# Patient Record
Sex: Male | Born: 1940 | Race: White | Hispanic: No | Smoking: Never smoker
Health system: Southern US, Community
[De-identification: ages and names within clinical notes are randomized; demographics above are authoritative.]

## PROBLEM LIST (undated history)

## (undated) DIAGNOSIS — F0390 Unspecified dementia without behavioral disturbance: Secondary | ICD-10-CM

## (undated) DIAGNOSIS — E039 Hypothyroidism, unspecified: Secondary | ICD-10-CM

---

## 2016-04-28 ENCOUNTER — Emergency Department (HOSPITAL_COMMUNITY): Payer: Self-pay

## 2016-04-28 ENCOUNTER — Emergency Department (HOSPITAL_COMMUNITY)
Admission: EM | Admit: 2016-04-28 | Discharge: 2016-04-28 | Disposition: A | Discharge status: Home / Self Care | Payer: Medicare Other | Attending: Emergency Medicine | Admitting: Emergency Medicine

## 2016-04-28 ENCOUNTER — Emergency Department (HOSPITAL_COMMUNITY)
Admission: EM | Admit: 2016-04-28 | Discharge: 2016-04-28 | Discharge status: Absent without leave | Payer: Self-pay | Source: Home / Self Care | Attending: Emergency Medicine | Admitting: Emergency Medicine

## 2016-04-28 ENCOUNTER — Emergency Department (HOSPITAL_COMMUNITY): Payer: Medicare Other

## 2016-04-28 ENCOUNTER — Ambulatory Visit (HOSPITAL_COMMUNITY)
Admission: RE | Admit: 2016-04-28 | Discharge: 2016-04-28 | Disposition: A | Discharge status: Home / Self Care | Payer: Medicare Other | Source: Ambulatory Visit | Attending: Emergency Medicine | Admitting: Emergency Medicine

## 2016-04-28 ENCOUNTER — Encounter (HOSPITAL_COMMUNITY): Payer: Self-pay | Admitting: Emergency Medicine

## 2016-04-28 DIAGNOSIS — Y999 Unspecified external cause status: Secondary | ICD-10-CM | POA: Diagnosis not present

## 2016-04-28 DIAGNOSIS — F0390 Unspecified dementia without behavioral disturbance: Secondary | ICD-10-CM | POA: Insufficient documentation

## 2016-04-28 DIAGNOSIS — Y92129 Unspecified place in nursing home as the place of occurrence of the external cause: Secondary | ICD-10-CM | POA: Diagnosis not present

## 2016-04-28 DIAGNOSIS — Y939 Activity, unspecified: Secondary | ICD-10-CM | POA: Diagnosis not present

## 2016-04-28 DIAGNOSIS — Z7982 Long term (current) use of aspirin: Secondary | ICD-10-CM | POA: Insufficient documentation

## 2016-04-28 DIAGNOSIS — Z79899 Other long term (current) drug therapy: Secondary | ICD-10-CM | POA: Diagnosis not present

## 2016-04-28 DIAGNOSIS — W19XXXA Unspecified fall, initial encounter: Secondary | ICD-10-CM

## 2016-04-28 NOTE — ED Provider Notes (Signed)
Patient denies pain anywhere. He is alert. HEENT exam normocephalic atraumatic neck nontender supple. Chest nontender abdomen and pelvis nontender back without point tenderness or flank tenderness. Left upper extremity with linear abrasion approximately 3 cm dorsal forearm no deformity or swelling or tenderness. All other extremities or contusion abrasion or tenderness neurovascularly intact   Doug SouSam Jourdan Durbin, MD 04/28/16 [phone]

## 2016-04-28 NOTE — ED Notes (Signed)
Male visitor came to nurse's desk stating that she was wife of pt's HPOA and would like information on what is going on. Informed her since I don't have a copy of HPOA I would have to get patient's permission before I can giver her any information.  Patient states that he knows male and male visitors and it was ok verbally for me to give them information.

## 2016-04-28 NOTE — ED Notes (Signed)
Previous information charted in error on wrong patient arrived by mistake.

## 2016-04-28 NOTE — ED Provider Notes (Signed)
[facility]-EMERGENCY DEPT Provider Note   CSN: [id number] Arrival date & time: 04/28/16  1546     History   Chief Complaint Chief Complaint  Patient presents with  . Fall    HPI Charles Conner is a 75 y.o. male.  HPI Patient presents to the emergency department Following an unwitnessed fall at the nursing home.  The patient cannot tell me what occurred.  The patient has a history of dementia.  The nursing facility stated they found him lying on the floor next to his bed.  They found no apparent injuries, but called EMS to transport for evaluation in the emergency department History reviewed. No pertinent past medical history.  There are no active problems to display for this patient.   History reviewed. No pertinent surgical history.     Home Medications    Prior to Admission medications   Medication Sig Start Date End Date Taking? Authorizing Provider  amLODipine (NORVASC) 5 MG tablet Take 5 mg by mouth daily.   Yes Historical Provider, MD  aspirin EC 81 MG tablet Take 81 mg by mouth daily.   Yes Historical Provider, MD  calcium-vitamin D (OSCAL WITH D) 500-200 MG-UNIT tablet Take 1 tablet by mouth 2 (two) times daily.   Yes Historical Provider, MD  cholecalciferol (VITAMIN D) 1000 units tablet Take 1,000 Units by mouth daily.   Yes Historical Provider, MD  divalproex (DEPAKOTE) 250 MG DR tablet Take 250 mg by mouth 2 (two) times daily.   Yes Historical Provider, MD  donepezil (ARICEPT) 10 MG tablet Take 10 mg by mouth at bedtime.   Yes Historical Provider, MD  folic acid (FOLVITE) 1 MG tablet Take 1 mg by mouth daily.   Yes Historical Provider, MD  ibuprofen (ADVIL,MOTRIN) 800 MG tablet Take 800 mg by mouth every 6 (six) hours as needed for moderate pain.   Yes Historical Provider, MD  iron polysaccharides (POLY-IRON 150) 150 MG capsule Take 300 mg by mouth daily.   Yes Historical Provider, MD  LORazepam (ATIVAN) 1 MG tablet Take 1 mg by mouth every 6 (six) hours as needed for  anxiety.   Yes Historical Provider, MD  mirtazapine (REMERON) 30 MG tablet Take 30 mg by mouth at bedtime.   Yes Historical Provider, MD  Multiple Vitamins-Minerals (MULTIVITAMIN ADULT) TABS Take 1 tablet by mouth daily.   Yes Historical Provider, MD  pantoprazole (PROTONIX) 40 MG tablet Take 40 mg by mouth 2 (two) times daily.   Yes Historical Provider, MD  promethazine (PHENERGAN) 12.5 MG tablet Take 12.5 mg by mouth every 6 (six) hours as needed for nausea or vomiting.   Yes Historical Provider, MD    Family History No family history on file.  Social History Social History  Substance Use Topics  . Smoking status: Not on file  . Smokeless tobacco: Not on file  . Alcohol use Not on file     Allergies   Patient has no known allergies.   Review of Systems Review of Systems Level V caveat applies due to dementia  Physical Exam Updated Vital Signs BP 145/87   Pulse 88   Temp 97.7 F (36.5 C) (Oral)   Resp 16   SpO2 98%   Physical Exam  Constitutional: He is oriented to person, place, and time. He appears well-developed and well-nourished. No distress.  HENT:  Head: Normocephalic and atraumatic.  Mouth/Throat: Oropharynx is clear and moist.  Eyes: Pupils are equal, round, and reactive to light.  Neck: Normal range of  motion. Neck supple.  Cardiovascular: Normal rate, regular rhythm and normal heart sounds.  Exam reveals no gallop and no friction rub.   No murmur heard. Pulmonary/Chest: Effort normal and breath sounds normal. No respiratory distress. He has no wheezes.  Musculoskeletal: Normal range of motion.       Right hip: Normal.       Left hip: Normal.       Arms: Neurological: He is alert and oriented to person, place, and time. He exhibits normal muscle tone. Coordination normal.  Skin: Skin is warm and dry. No rash noted. No erythema.  Psychiatric: He has a normal mood and affect. His behavior is normal.  Nursing note and vitals reviewed.    ED Treatments  / Results  Labs (all labs ordered are listed, but only abnormal results are displayed) Labs Reviewed - No data to display  EKG  EKG Interpretation None       Radiology Ct Head Wo Contrast  Result Date: 04/28/2016 CLINICAL DATA:  Unwitnessed fall.  Patient was found down. EXAM: CT HEAD WITHOUT CONTRAST CT CERVICAL SPINE WITHOUT CONTRAST TECHNIQUE: Multidetector CT imaging of the head and cervical spine was performed following the standard protocol without intravenous contrast. Multiplanar CT image reconstructions of the cervical spine were also generated. COMPARISON:  None. FINDINGS: CT HEAD FINDINGS Brain: Encephalomalacia in the left occipital lobe is consistent with remote infarct. Lacunar infarct identified inferior left cerebellum There is no evidence for acute hemorrhage, hydrocephalus, mass lesion, or abnormal extra-axial fluid collection. No definite CT evidence for acute infarction. Diffuse loss of parenchymal volume is consistent with atrophy. Patchy low attenuation in the deep hemispheric and periventricular white matter is nonspecific, but likely reflects chronic microvascular ischemic demyelination. Vascular: Atherosclerotic calcification is visualized in the carotid arteries. No dense MCA sign. Major dural sinuses are unremarkable. Skull: No evidence for fracture. No worrisome lytic or sclerotic lesion. Sinuses/Orbits: Mild mucosal thickening noted ethmoid air cells, compatible with chronic sinusitis. Remaining visualized paranasal sinuses and mastoid air cells are clear. Visualized portions of the globes and intraorbital fat are unremarkable. Other: None. CT CERVICAL SPINE FINDINGS Alignment: Accentuated cervical lordosis. No evidence for subluxation. Skull base and vertebrae: No evidence for an acute fracture from the skullbase through the T1-2 interspace. Scattered facet degeneration is noted. Soft tissues and spinal canal: No prevertebral fluid or swelling. No visible canal hematoma.  Disc levels:  Loss of disc height is seen at C3-4, C4-5, and C6-7. Upper chest: Pleural-parenchymal scarring noted in the lung apices. Other: None. IMPRESSION: 1. No acute intracranial abnormality. 2. 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 3. Diffuse degenerative changes without acute fracture in the cervical spine. Electronically Signed   By: Kennith Center M.D.   On: 04/28/2016 17:54   Ct Cervical Spine Wo Contrast  Result Date: 04/28/2016 CLINICAL DATA:  Unwitnessed fall.  Patient was found down. EXAM: CT HEAD WITHOUT CONTRAST CT CERVICAL SPINE WITHOUT CONTRAST TECHNIQUE: Multidetector CT imaging of the head and cervical spine was performed following the standard protocol without intravenous contrast. Multiplanar CT image reconstructions of the cervical spine were also generated. COMPARISON:  None. FINDINGS: CT HEAD FINDINGS Brain: Encephalomalacia in the left occipital lobe is consistent with remote infarct. Lacunar infarct identified inferior left cerebellum There is no evidence for acute hemorrhage, hydrocephalus, mass lesion, or abnormal extra-axial fluid collection. No definite CT evidence for acute infarction. Diffuse loss of parenchymal volume is consistent with atrophy. Patchy low  attenuation in the deep hemispheric and periventricular white matter is nonspecific, but likely reflects chronic microvascular ischemic demyelination. Vascular: Atherosclerotic calcification is visualized in the carotid arteries. No dense MCA sign. Major dural sinuses are unremarkable. Skull: No evidence for fracture. No worrisome lytic or sclerotic lesion. Sinuses/Orbits: Mild mucosal thickening noted ethmoid air cells, compatible with chronic sinusitis. Remaining visualized paranasal sinuses and mastoid air cells are clear. Visualized portions of the globes and intraorbital fat are unremarkable. Other: None. CT CERVICAL SPINE FINDINGS Alignment:  Accentuated cervical lordosis. No evidence for subluxation. Skull base and vertebrae: No evidence for an acute fracture from the skullbase through the T1-2 interspace. Scattered facet degeneration is noted. Soft tissues and spinal canal: No prevertebral fluid or swelling. No visible canal hematoma. Disc levels:  Loss of disc height is seen at C3-4, C4-5, and C6-7. Upper chest: Pleural-parenchymal scarring noted in the lung apices. Other: None. IMPRESSION: 1. No acute intracranial abnormality. 2. 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 3. Diffuse degenerative changes without acute fracture in the cervical spine. Electronically Signed   By: Kennith CenterEric  Mansell M.D.   On: 04/28/2016 17:54    Procedures Procedures (including critical care time)  Medications Ordered in ED Medications - No data to display   Initial Impression / Assessment and Plan / ED Course  I have reviewed the triage vital signs and the nursing notes.  Pertinent labs & imaging results that were available during my care of the patient were reviewed by me and considered in my medical decision making (see chart for details).  Clinical Course     Patient has negative imaging would have the patient follow with his primary care Dr. told to return here as needed  Final Clinical Impressions(s) / ED Diagnoses   Final diagnoses:  None    New Prescriptions New Prescriptions   No medications on file     Charlestine NightChristopher [employer], PA-C 04/28/16 2128    Doug SouSam Jacubowitz, MD 04/29/16 [phone]

## 2016-04-28 NOTE — ED Triage Notes (Signed)
[facility] EMS [facility], patient was found in floor by staff. Patient has scratch on left forearm, unsure if that is from today's fall or not.  Patient does have dementia but per EMS patient able to answer questions. Patient denies pain with EMS. Patient ambulates with assistance.  Vitals: 104hr r18, 153/85

## 2016-04-28 NOTE — ED Notes (Signed)
[facility] contacted for transport 

## 2016-04-28 NOTE — ED Notes (Signed)
ED Provider at bedside. 

## 2016-04-28 NOTE — ED Notes (Signed)
Tried assisting patient with urinal, patient states can't void at this time

## 2016-04-28 NOTE — ED Provider Notes (Signed)
I personally evaluated patient here in the ED, along with attending physician Dr. Pecola Leisureeese. I completed my assessment and wrote my note, and proceeded with imaging studies including CT head and cervical spine. Patient's results returned with no acute findings. I noticed on the tracking board that the patient was no longer listed in the department. Upon chart review I see that another provider had evaluated the patient and proceeded with workup. I spoke with charge nurse who reports that patients name was entered under the wrong patient with a similar name, I was unaware of this. Patient received 2 CT head and CT cervical spine exams here in the ED. Due to my information being under another patient's name was deleted, care was continued by subsequent providers evaluated the patient.   Eyvonne MechanicJeffrey Tuwanda Vokes, PA-C 04/28/16 1824    Doug SouSam Jacubowitz, MD 04/29/16 [phone]

## 2016-04-28 NOTE — ED Notes (Signed)
Bed: [id number] Expected date:  Expected time:  Means of arrival:  Comments: EMS: fall 75

## 2016-04-28 NOTE — ED Notes (Signed)
Patient stated that he has to use bathroom.  Assisted with urinal, patient unable to void at this time.

## 2016-04-28 NOTE — ED Notes (Signed)
[facility] per Dr Shela CommonsJ request to find out when patient's last tetanus was administered, was told patient is new to facility (admitted 5 days ago) and they don't have record of last shot.

## 2016-05-01 NOTE — ED Triage Notes (Addendum)
Pt not present on arrival this morning. Never triaged. Waiting for merge of charts

## 2017-10-04 IMAGING — CT CT CERVICAL SPINE W/O CM
4 of 8 series · 13 of 33 positions shown, 14 images · non-contrast
Comparison: None.

CLINICAL DATA: Unwitnessed fall.  Patient was found down.

EXAM:
CT HEAD WITHOUT CONTRAST
CT CERVICAL SPINE WITHOUT CONTRAST
TECHNIQUE: Multidetector CT imaging of the head and cervical spine was
performed following the standard protocol without intravenous
contrast. Multiplanar CT image reconstructions of the cervical spine
were also generated.

[Series 4: bone windows · axial · 0.43mm/px · z∈[-67,-15]mm · 2 of 79 slices shown]
[im 27/79  bone]
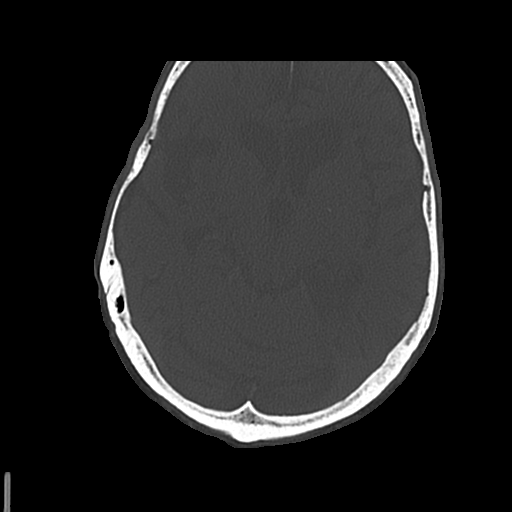
[im 53/79  bone]
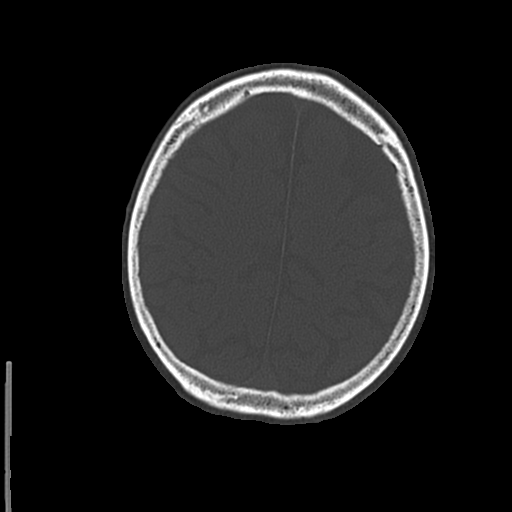

[Series 6: coronal · coronal · 0.31mm/px · 3 of 74 slices shown]
[im 19/74  bone]
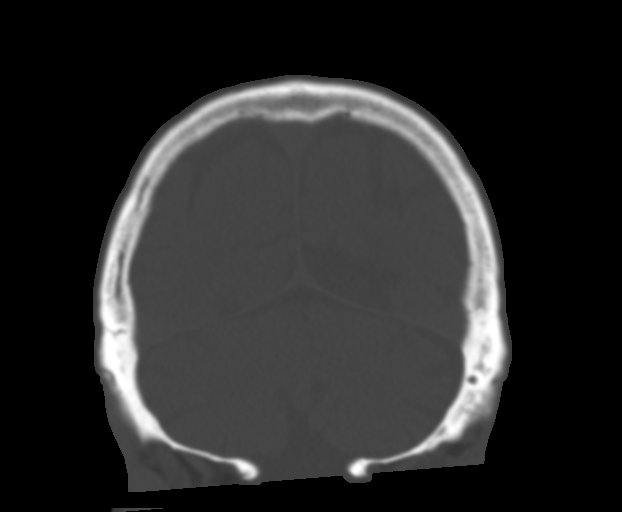
[im 37/74  bone]
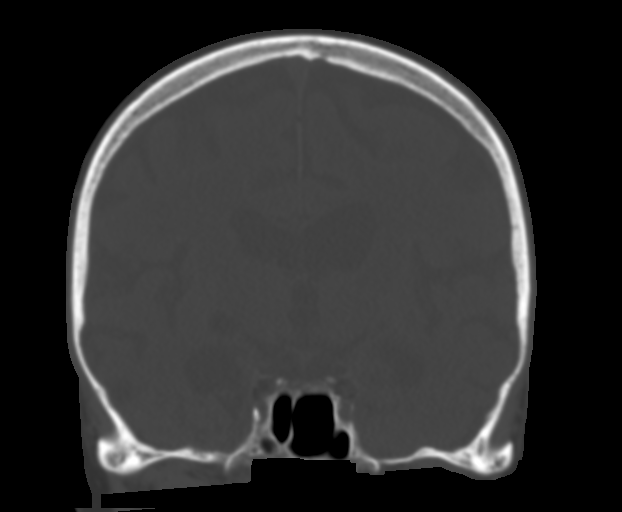
[im 55/74  bone]
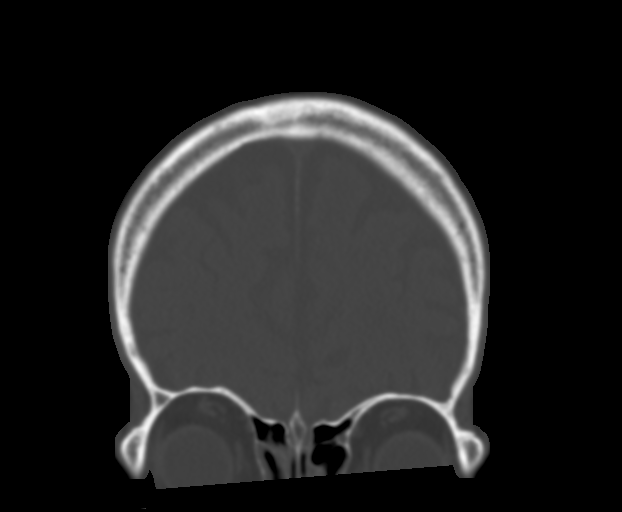

[Series 11: axial recon · axial · 0.23mm/px · z∈[-256,-167]mm · 3 of 105 slices shown, 4 images]
[im 27/105  soft-tissue]
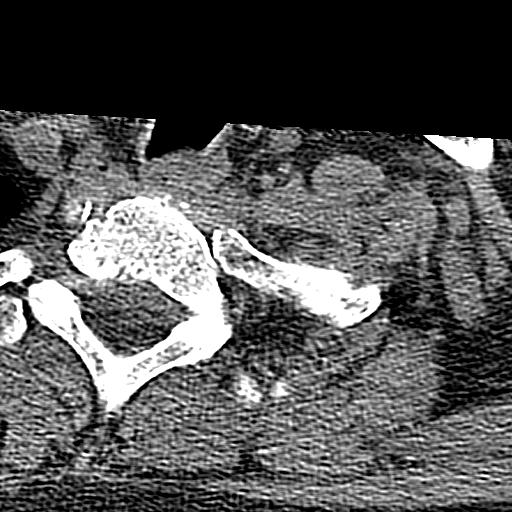
[im 27/105  bone]
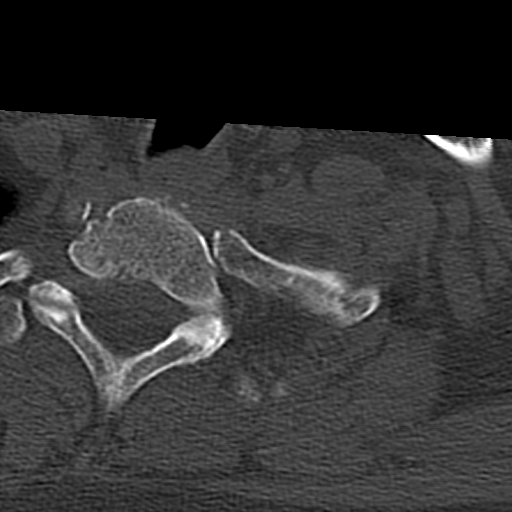
[im 53/105  bone]
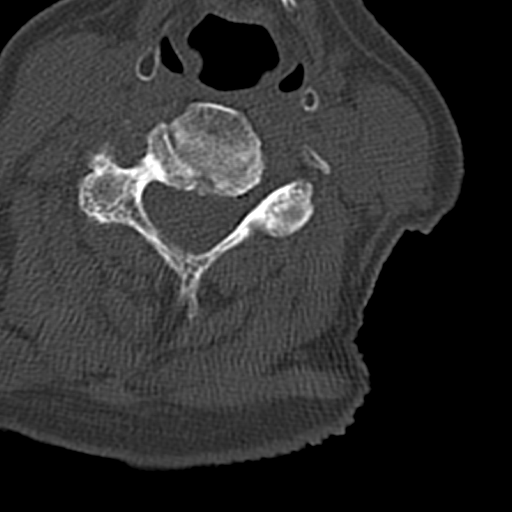
[im 79/105  bone]
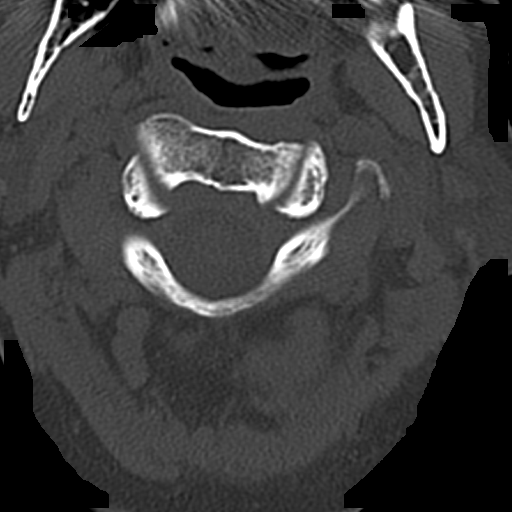

[Series 13: sagittal · sagittal · 0.23mm/px · 5 of 61 slices shown]
[im 11/61  bone]
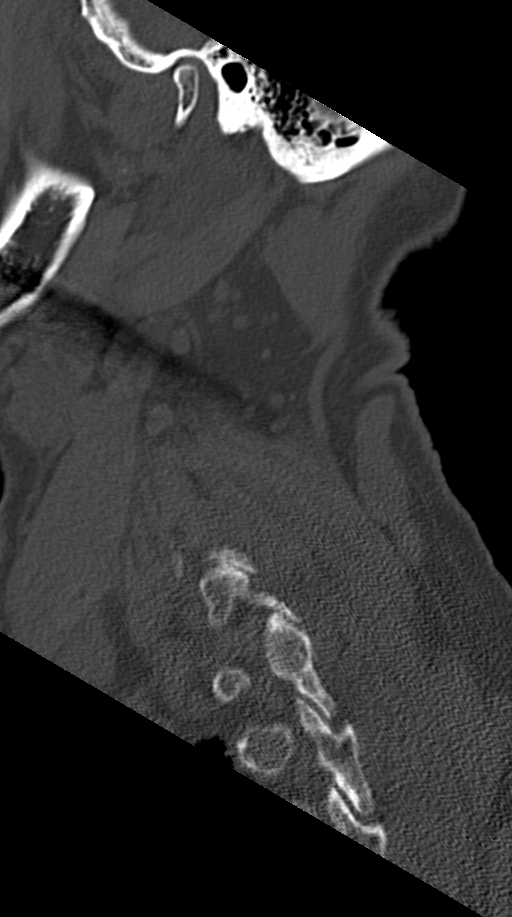
[im 21/61  bone]
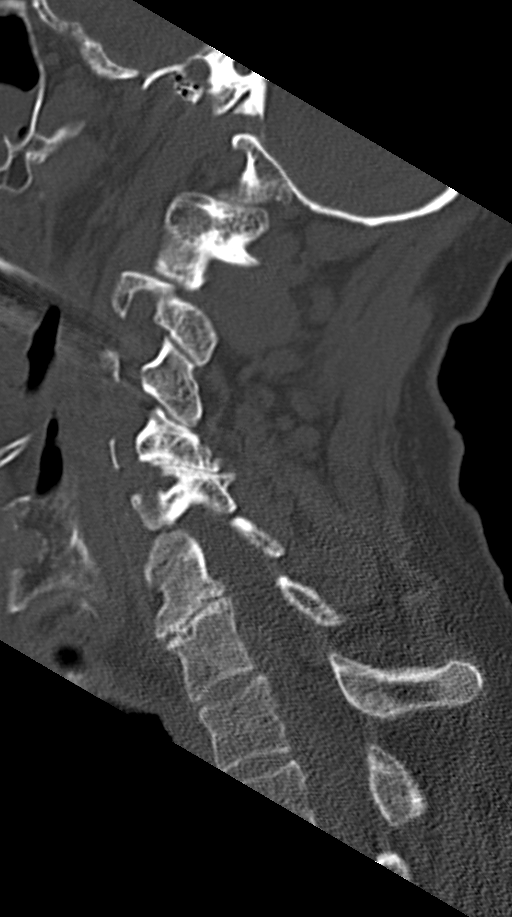
[im 31/61  bone]
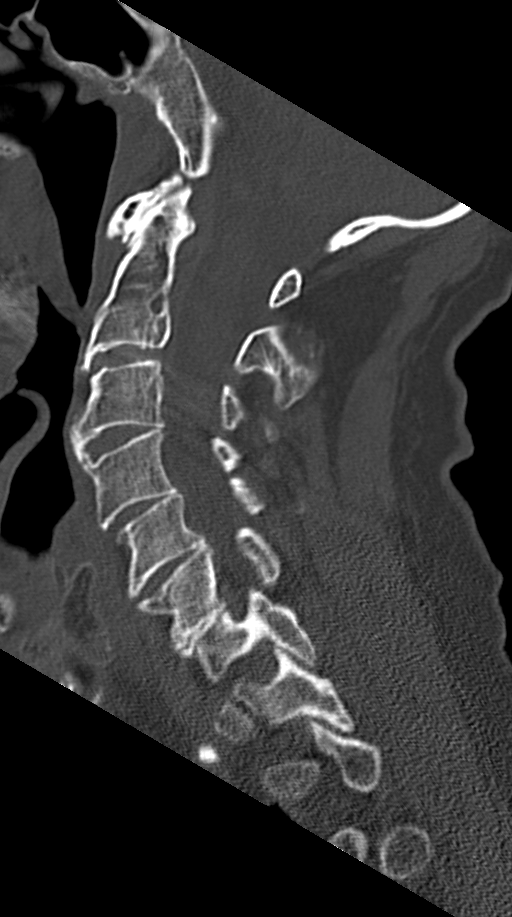
[im 41/61  bone]
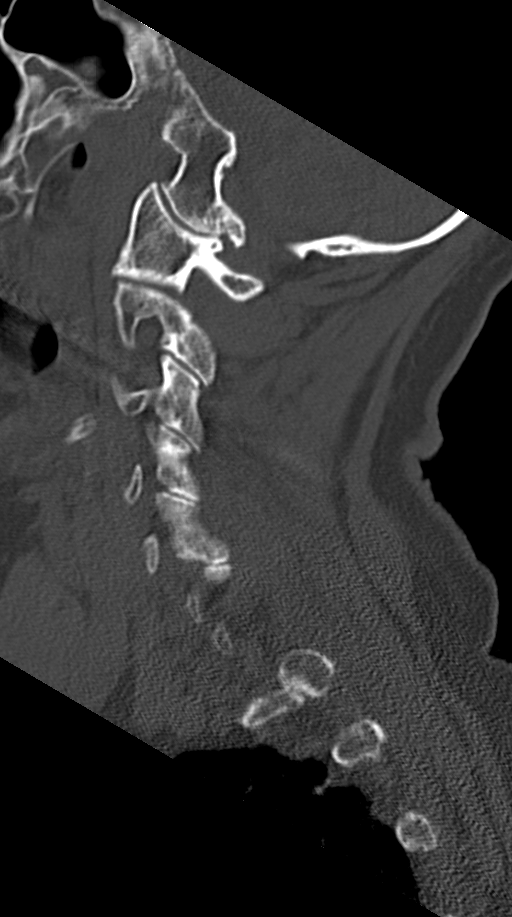
[im 51/61  bone]
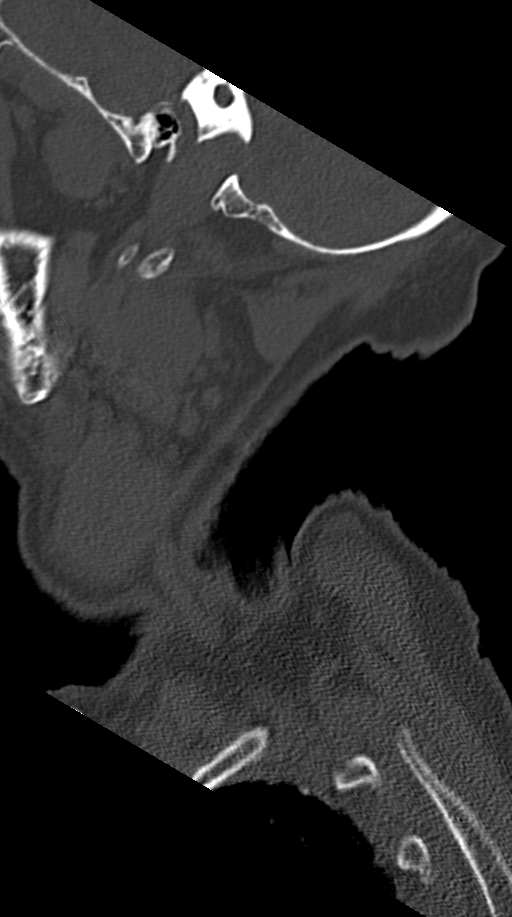

[13 of 33 positions shown; findings below may reference images not displayed]

FINDINGS: CT HEAD FINDINGS

Brain: Encephalomalacia in the left occipital lobe is consistent
with remote infarct. Lacunar infarct identified inferior left
cerebellum There is no evidence for acute hemorrhage, hydrocephalus,
mass lesion, or abnormal extra-axial fluid collection. No definite
CT evidence for acute infarction. Diffuse loss of parenchymal volume
is consistent with atrophy. Patchy low attenuation in the deep
hemispheric and periventricular white matter is nonspecific, but
likely reflects chronic microvascular ischemic demyelination.

Vascular: Atherosclerotic calcification is visualized in the carotid
arteries. No dense MCA sign. Major dural sinuses are unremarkable.

Skull: No evidence for fracture. No worrisome lytic or sclerotic
lesion.

Sinuses/Orbits: Mild mucosal thickening noted ethmoid air cells,
compatible with chronic sinusitis. Remaining visualized paranasal
sinuses and mastoid air cells are clear. Visualized portions of the
globes and intraorbital fat are unremarkable.

Other: None.

CT CERVICAL SPINE FINDINGS

Alignment: Accentuated cervical lordosis. No evidence for
subluxation.

Skull base and vertebrae: No evidence for an acute fracture from the
skullbase through the T1-2 interspace. Scattered facet degeneration
is noted.

Soft tissues and spinal canal: No prevertebral fluid or swelling. No
visible canal hematoma.

Disc levels:  Loss of disc height is seen at C3-4, C4-5, and C6-7.

Upper chest: Pleural-parenchymal scarring noted in the lung apices.

Other: None.
IMPRESSION: 1. No acute intracranial abnormality.
2. Atrophy with chronic small vessel white matter ischemic disease.
Old left occipital and inferior cerebellar infarcts.
3. Diffuse degenerative changes without acute fracture in the
cervical spine.

## 2017-11-09 ENCOUNTER — Inpatient Hospital Stay (HOSPITAL_COMMUNITY)
Admission: EM | Admit: 2017-11-09 | Discharge: 2017-11-13 | DRG: 683 | Disposition: A | Discharge status: Hospice | Payer: Medicare Other | Attending: Internal Medicine | Admitting: Internal Medicine

## 2017-11-09 ENCOUNTER — Encounter (HOSPITAL_COMMUNITY): Payer: Self-pay | Admitting: Emergency Medicine

## 2017-11-09 ENCOUNTER — Other Ambulatory Visit: Payer: Self-pay

## 2017-11-09 ENCOUNTER — Emergency Department (HOSPITAL_COMMUNITY): Payer: Medicare Other

## 2017-11-09 DIAGNOSIS — R630 Anorexia: Secondary | ICD-10-CM

## 2017-11-09 DIAGNOSIS — Z781 Physical restraint status: Secondary | ICD-10-CM

## 2017-11-09 DIAGNOSIS — K219 Gastro-esophageal reflux disease without esophagitis: Secondary | ICD-10-CM | POA: Diagnosis present

## 2017-11-09 DIAGNOSIS — Z7982 Long term (current) use of aspirin: Secondary | ICD-10-CM

## 2017-11-09 DIAGNOSIS — Z66 Do not resuscitate: Secondary | ICD-10-CM | POA: Diagnosis present

## 2017-11-09 DIAGNOSIS — N401 Enlarged prostate with lower urinary tract symptoms: Secondary | ICD-10-CM | POA: Diagnosis present

## 2017-11-09 DIAGNOSIS — R627 Adult failure to thrive: Secondary | ICD-10-CM | POA: Diagnosis present

## 2017-11-09 DIAGNOSIS — I10 Essential (primary) hypertension: Secondary | ICD-10-CM | POA: Diagnosis present

## 2017-11-09 DIAGNOSIS — N179 Acute kidney failure, unspecified: Secondary | ICD-10-CM | POA: Diagnosis present

## 2017-11-09 DIAGNOSIS — E039 Hypothyroidism, unspecified: Secondary | ICD-10-CM | POA: Diagnosis present

## 2017-11-09 DIAGNOSIS — D72829 Elevated white blood cell count, unspecified: Secondary | ICD-10-CM | POA: Diagnosis present

## 2017-11-09 DIAGNOSIS — F0391 Unspecified dementia with behavioral disturbance: Secondary | ICD-10-CM | POA: Diagnosis present

## 2017-11-09 DIAGNOSIS — Z79899 Other long term (current) drug therapy: Secondary | ICD-10-CM

## 2017-11-09 DIAGNOSIS — Z79890 Hormone replacement therapy (postmenopausal): Secondary | ICD-10-CM | POA: Diagnosis not present

## 2017-11-09 DIAGNOSIS — E46 Unspecified protein-calorie malnutrition: Secondary | ICD-10-CM | POA: Diagnosis present

## 2017-11-09 DIAGNOSIS — E872 Acidosis: Secondary | ICD-10-CM | POA: Diagnosis present

## 2017-11-09 DIAGNOSIS — F0390 Unspecified dementia without behavioral disturbance: Secondary | ICD-10-CM | POA: Diagnosis present

## 2017-11-09 DIAGNOSIS — Z515 Encounter for palliative care: Secondary | ICD-10-CM | POA: Diagnosis present

## 2017-11-09 DIAGNOSIS — R338 Other retention of urine: Secondary | ICD-10-CM | POA: Diagnosis present

## 2017-11-09 DIAGNOSIS — F319 Bipolar disorder, unspecified: Secondary | ICD-10-CM | POA: Diagnosis present

## 2017-11-09 DIAGNOSIS — E860 Dehydration: Secondary | ICD-10-CM

## 2017-11-09 DIAGNOSIS — E870 Hyperosmolality and hypernatremia: Secondary | ICD-10-CM | POA: Diagnosis present

## 2017-11-09 HISTORY — DX: Hypothyroidism, unspecified: E03.9

## 2017-11-09 HISTORY — DX: Unspecified dementia, unspecified severity, without behavioral disturbance, psychotic disturbance, mood disturbance, and anxiety: F03.90

## 2017-11-09 LAB — COMPREHENSIVE METABOLIC PANEL
ALT: 13 U/L (ref 0–44)
ANION GAP: 19 — AB (ref 5–15)
AST: 15 U/L (ref 15–41)
Albumin: 4.1 g/dL (ref 3.5–5.0)
Alkaline Phosphatase: 95 U/L (ref 38–126)
BUN: 81 mg/dL — ABNORMAL HIGH (ref 8–23)
CALCIUM: 9.2 mg/dL (ref 8.9–10.3)
CHLORIDE: 122 mmol/L — AB (ref 98–111)
CO2: 19 mmol/L — AB (ref 22–32)
Creatinine, Ser: 3.22 mg/dL — ABNORMAL HIGH (ref 0.61–1.24)
GFR calc non Af Amer: 17 mL/min — ABNORMAL LOW (ref >60)
GFR, EST AFRICAN AMERICAN: 20 mL/min — AB (ref >60)
Glucose, Bld: 105 mg/dL — ABNORMAL HIGH (ref 70–99)
POTASSIUM: 4.7 mmol/L (ref 3.5–5.1)
SODIUM: 160 mmol/L — AB (ref 135–145)
Total Bilirubin: 1 mg/dL (ref 0.3–1.2)
Total Protein: 7.9 g/dL (ref 6.5–8.1)

## 2017-11-09 LAB — I-STAT CG4 LACTIC ACID, ED
Lactic Acid, Venous: 1.86 mmol/L (ref 0.5–1.9)
Lactic Acid, Venous: 3.78 mmol/L (ref 0.5–1.9)

## 2017-11-09 LAB — CBC WITH DIFFERENTIAL/PLATELET
BASOS ABS: 0.1 10*3/uL (ref 0.0–0.1)
Basophils Relative: 1 %
EOS PCT: 0 %
Eosinophils Absolute: 0 10*3/uL (ref 0.0–0.7)
HCT: 52.6 % — ABNORMAL HIGH (ref 39.0–52.0)
Hemoglobin: 17.4 g/dL — ABNORMAL HIGH (ref 13.0–17.0)
LYMPHS PCT: 19 %
Lymphs Abs: 2.6 10*3/uL (ref 0.7–4.0)
MCH: 33 pg (ref 26.0–34.0)
MCHC: 33.1 g/dL (ref 30.0–36.0)
MCV: 99.8 fL (ref 78.0–100.0)
Monocytes Absolute: 1.1 10*3/uL — ABNORMAL HIGH (ref 0.1–1.0)
Monocytes Relative: 8 %
NEUTROS ABS: 10 10*3/uL — AB (ref 1.7–7.7)
Neutrophils Relative %: 72 %
Platelets: 226 10*3/uL (ref 150–400)
RBC: 5.27 MIL/uL (ref 4.22–5.81)
RDW: 15.5 % (ref 11.5–15.5)
WBC: 13.8 10*3/uL — AB (ref 4.0–10.5)

## 2017-11-09 LAB — BRAIN NATRIURETIC PEPTIDE: B NATRIURETIC PEPTIDE 5: 48.9 pg/mL (ref 0.0–100.0)

## 2017-11-09 LAB — I-STAT TROPONIN, ED: Troponin i, poc: 0.03 ng/mL (ref 0.00–0.08)

## 2017-11-09 LAB — LIPASE, BLOOD: Lipase: 39 U/L (ref 11–51)

## 2017-11-09 MED ORDER — ASPIRIN EC 81 MG PO TBEC
81.0000 mg | DELAYED_RELEASE_TABLET | Freq: Every day | ORAL | Status: DC
  Administered 2017-11-10: 81 mg via ORAL
  Filled 2017-11-09 (×2): qty 1

## 2017-11-09 MED ORDER — DOCUSATE SODIUM 100 MG PO CAPS
100.0000 mg | ORAL_CAPSULE | Freq: Every day | ORAL | Status: DC
  Administered 2017-11-10: 100 mg via ORAL
  Filled 2017-11-09 (×2): qty 1

## 2017-11-09 MED ORDER — ACETAMINOPHEN 650 MG RE SUPP: 650.0000 mg | Freq: Four times a day (QID) | RECTAL | Status: DC | PRN

## 2017-11-09 MED ORDER — DEXTROSE 5 % IV SOLN
INTRAVENOUS | Status: AC
  Administered 2017-11-09 – 2017-11-10 (×3): via INTRAVENOUS
  Filled 2017-11-09: qty 1000

## 2017-11-09 MED ORDER — ADULT MULTIVITAMIN W/MINERALS CH
1.0000 | ORAL_TABLET | Freq: Every day | ORAL | Status: DC
  Administered 2017-11-10: 1 via ORAL
  Filled 2017-11-09 (×2): qty 1

## 2017-11-09 MED ORDER — MIRTAZAPINE 15 MG PO TABS
15.0000 mg | ORAL_TABLET | Freq: Every day | ORAL | Status: DC
  Administered 2017-11-09 – 2017-11-11 (×2): 15 mg via ORAL
  Filled 2017-11-09 (×4): qty 1

## 2017-11-09 MED ORDER — SODIUM CHLORIDE 0.9 % IV BOLUS
1000.0000 mL | Freq: Once | INTRAVENOUS | Status: AC
Start: 2017-11-09 — End: 2017-11-09
  Administered 2017-11-09: 1000 mL via INTRAVENOUS

## 2017-11-09 MED ORDER — SODIUM CHLORIDE 0.9 % IV SOLN
Freq: Once | INTRAVENOUS | Status: AC
  Administered 2017-11-11: 11:00:00 via INTRAVENOUS

## 2017-11-09 MED ORDER — FOLIC ACID 1 MG PO TABS
1.0000 mg | ORAL_TABLET | Freq: Every day | ORAL | Status: DC
  Administered 2017-11-10: 1 mg via ORAL
  Filled 2017-11-09 (×2): qty 1

## 2017-11-09 MED ORDER — ONDANSETRON HCL 4 MG PO TABS: 4.0000 mg | ORAL_TABLET | Freq: Four times a day (QID) | ORAL | Status: DC | PRN

## 2017-11-09 MED ORDER — SODIUM CHLORIDE 0.9 % IV SOLN: Freq: Once | INTRAVENOUS | Status: DC

## 2017-11-09 MED ORDER — LEVOTHYROXINE SODIUM 25 MCG PO TABS
25.0000 ug | ORAL_TABLET | Freq: Every day | ORAL | Status: DC
  Administered 2017-11-10: 25 ug via ORAL
  Filled 2017-11-09 (×3): qty 1

## 2017-11-09 MED ORDER — POLYSACCHARIDE IRON COMPLEX 150 MG PO CAPS
300.0000 mg | ORAL_CAPSULE | Freq: Every day | ORAL | Status: DC
  Administered 2017-11-10: 300 mg via ORAL
  Filled 2017-11-09 (×3): qty 2

## 2017-11-09 MED ORDER — TAMSULOSIN HCL 0.4 MG PO CAPS
0.4000 mg | ORAL_CAPSULE | Freq: Every day | ORAL | Status: DC
  Administered 2017-11-09 – 2017-11-11 (×2): 0.4 mg via ORAL
  Filled 2017-11-09 (×4): qty 1

## 2017-11-09 MED ORDER — ONDANSETRON HCL 4 MG/2ML IJ SOLN: 4.0000 mg | Freq: Four times a day (QID) | INTRAMUSCULAR | Status: DC | PRN

## 2017-11-09 MED ORDER — AMLODIPINE BESYLATE 5 MG PO TABS
5.0000 mg | ORAL_TABLET | Freq: Every day | ORAL | Status: DC
  Administered 2017-11-10: 5 mg via ORAL
  Filled 2017-11-09 (×2): qty 1

## 2017-11-09 MED ORDER — ACETAMINOPHEN 325 MG PO TABS: 650.0000 mg | ORAL_TABLET | Freq: Four times a day (QID) | ORAL | Status: DC | PRN

## 2017-11-09 MED ORDER — MEMANTINE HCL 10 MG PO TABS
10.0000 mg | ORAL_TABLET | Freq: Two times a day (BID) | ORAL | Status: DC
  Administered 2017-11-09 – 2017-11-11 (×3): 10 mg via ORAL
  Filled 2017-11-09 (×6): qty 1

## 2017-11-09 MED ORDER — PANTOPRAZOLE SODIUM 40 MG PO TBEC
40.0000 mg | DELAYED_RELEASE_TABLET | Freq: Two times a day (BID) | ORAL | Status: DC
  Administered 2017-11-09 – 2017-11-11 (×3): 40 mg via ORAL
  Filled 2017-11-09 (×6): qty 1

## 2017-11-09 MED ORDER — PROMETHAZINE HCL 25 MG PO TABS: 12.5000 mg | ORAL_TABLET | Freq: Four times a day (QID) | ORAL | Status: DC | PRN

## 2017-11-09 MED ORDER — DONEPEZIL HCL 10 MG PO TABS
10.0000 mg | ORAL_TABLET | Freq: Every day | ORAL | Status: DC
  Administered 2017-11-09 – 2017-11-11 (×2): 10 mg via ORAL
  Filled 2017-11-09 (×4): qty 1

## 2017-11-09 MED ORDER — DIVALPROEX SODIUM 250 MG PO DR TAB
250.0000 mg | DELAYED_RELEASE_TABLET | Freq: Two times a day (BID) | ORAL | Status: DC
  Administered 2017-11-09 – 2017-11-11 (×3): 250 mg via ORAL
  Filled 2017-11-09 (×6): qty 1

## 2017-11-09 MED ORDER — ENSURE ENLIVE PO LIQD
Freq: Three times a day (TID) | ORAL | Status: DC
  Administered 2017-11-10 – 2017-11-11 (×3): 237 mL via ORAL

## 2017-11-09 NOTE — ED Notes (Addendum)
Bladder scan: 

## 2017-11-09 NOTE — ED Notes (Signed)
Pt has condom cath

## 2017-11-09 NOTE — ED Triage Notes (Signed)
[facility] EMS[facility] who said that he did not eat lunch or breakfast and they want to know why.

## 2017-11-09 NOTE — ED Notes (Signed)
[facility] Expected date:  Expected time:  Means of arrival:  Comments: EMS-nursing home

## 2017-11-09 NOTE — ED Notes (Signed)
Offered food, but politely said, "No thank you, I'm good."

## 2017-11-09 NOTE — ED Notes (Signed)
Attempted to undress patient from clothes from home and place into hospital gown- pt got verbally aggressive. Pt still in clothes from home.

## 2017-11-09 NOTE — ED Provider Notes (Signed)
[facility]-EMERGENCY DEPT Provider Note   CSN: [id number] Arrival date & time: 11/09/17  1442     History   Chief Complaint Chief Complaint  Patient presents with  . Didn't eat breakfast    HPI Charles Conner is a 77 y.o. male.  HPI At this point, have not been able to obtain any useful history.  The nursing home report is concerned because the patient did not eat breakfast or lunch.  No other apparent information that indicates possible source of the problem.  Reportedly, patient has severe dementia and is hostile at baseline.  The only information I can give from the patient, is "there is nothing wrong with me.  Get away from me bitch."  In conjunction with this he makes some threatening gestures that he might strike and pulls his blankets tightly up about his body.  I have made several times to use the number listed as the power of attorney without success. History reviewed. No pertinent past medical history.  There are no active problems to display for this patient.   History reviewed. No pertinent surgical history.      Home Medications    Prior to Admission medications   Medication Sig Start Date End Date Taking? Authorizing Provider  amLODipine (NORVASC) 5 MG tablet Take 5 mg by mouth daily.   Yes [provider]  aspirin EC 81 MG tablet Take 81 mg by mouth daily.   Yes [provider]  calcium-vitamin D (OSCAL WITH D) 500-200 MG-UNIT tablet Take 1 tablet by mouth 2 (two) times daily.   Yes [provider]  divalproex (DEPAKOTE) 250 MG DR tablet Take 250 mg by mouth 2 (two) times daily.   Yes [provider]  docusate sodium (COLACE) 100 MG capsule Take 100 mg by mouth daily.   Yes [provider]  donepezil (ARICEPT) 10 MG tablet Take 10 mg by mouth at bedtime.   Yes [provider]  folic acid (FOLVITE) 1 MG tablet Take 1 mg by mouth daily.   Yes [provider]  iron polysaccharides  (POLY-IRON 150) 150 MG capsule Take 300 mg by mouth daily.   Yes [provider]  levothyroxine (SYNTHROID, LEVOTHROID) 25 MCG tablet Take 25 mcg by mouth daily before breakfast.   Yes [provider]  memantine (NAMENDA) 10 MG tablet Take 10 mg by mouth 2 (two) times daily.   Yes [provider]  mirtazapine (REMERON) 15 MG tablet Take 15 mg by mouth at bedtime.   Yes [provider]  Multiple Vitamins-Minerals (MULTIVITAMIN ADULT) TABS Take 1 tablet by mouth daily.   Yes [provider]  Nutritional Supplements (NUTRITIONAL DRINK PO) Take 118 mLs by mouth 3 (three) times daily.   Yes [provider]  omeprazole (PRILOSEC) 20 MG capsule Take 20 mg by mouth daily.   Yes [provider]  pantoprazole (PROTONIX) 40 MG tablet Take 40 mg by mouth 2 (two) times daily.   Yes [provider]  promethazine (PHENERGAN) 12.5 MG tablet Take 12.5 mg by mouth every 6 (six) hours as needed for nausea or vomiting.   Yes [provider]  tamsulosin (FLOMAX) 0.4 MG CAPS capsule Take 0.4 mg by mouth at bedtime.   Yes [provider]    Family History History reviewed. No pertinent family history.  Social History Social History   Tobacco Use  . Smoking status: Not on file  Substance Use Topics  . Alcohol use: Not on  file  . Drug use: Not on file     Allergies   Patient has no known allergies.   Review of Systems Review of Systems Level 5 caveat cannot obtain review of systems due to patient dementia\hostility.  Physical Exam Updated Vital Signs BP (!) 140/91   Pulse 71   Temp 99.6 F (37.6 C) (Rectal)   Resp (!) 29   SpO2 100%   Physical Exam  Constitutional:  The patient is lying in a hall bed stretcher.  He is lying still with his eyes closed.  No distress.  Once I attempt to interview him, he opens his eyes and refuses exam or interaction.  HENT:  Head: Normocephalic and atraumatic.  Eyes: EOM  are normal.  Pulmonary/Chest: Effort normal.  Neurological: He is alert.  Patient awake to alert mental status.  His few statements are clear and definitive.  He uses both arms to briskly pull his blankets about himself.     ED Treatments / Results  Labs (all labs ordered are listed, but only abnormal results are displayed) Labs Reviewed  COMPREHENSIVE METABOLIC PANEL - Abnormal; Notable for the following components:      Result Value   Sodium 160 (*)    Chloride 122 (*)    CO2 19 (*)    Glucose, Bld 105 (*)    BUN 81 (*)    Creatinine, Ser 3.22 (*)    GFR calc non Af Amer 17 (*)    GFR calc Af Amer 20 (*)    Anion gap 19 (*)    All other components within normal limits  CBC WITH DIFFERENTIAL/PLATELET - Abnormal; Notable for the following components:   WBC 13.8 (*)    Hemoglobin 17.4 (*)    HCT 52.6 (*)    Neutro Abs 10.0 (*)    Monocytes Absolute 1.1 (*)    All other components within normal limits  CULTURE, BLOOD (ROUTINE X 2)  CULTURE, BLOOD (ROUTINE X 2)  URINE CULTURE  LIPASE, BLOOD  BRAIN NATRIURETIC PEPTIDE  URINALYSIS, ROUTINE W REFLEX MICROSCOPIC  I-STAT CG4 LACTIC ACID, ED  I-STAT TROPONIN, ED  I-STAT CG4 LACTIC ACID, ED    EKG EKG Interpretation  Date/Time:  Friday November 09 2017 19:12:28 EDT Ventricular Rate:  97 PR Interval:    QRS Duration: 81 QT Interval:  369 QTC Calculation: 469 R Axis:   54 Text Interpretation:  Sinus rhythm Borderline short PR interval Repol abnrm suggests ischemia, lateral leads Baseline wander in lead(s) V3 agree. no old comparison Confirmed by Arby BarrettePfeiffer, Margree Gimbel [phone]([id number]) on 11/09/2017 8:15:30 PM   Radiology Dg Abd 1 View  Result Date: 11/09/2017 CLINICAL DATA:  Refusing oral intake. EXAM: ABDOMEN - 1 VIEW COMPARISON:  None. FINDINGS: The bowel gas pattern is normal. No radio-opaque calculi or other significant radiographic abnormality are seen. Degenerative changes of the lumbar spine. IMPRESSION: Negative. Electronically Signed    By: Obie DredgeWilliam T Derry M.D.   On: 11/09/2017 20:54   Dg Chest Port 1 View  Result Date: 11/09/2017 CLINICAL DATA:  Altered mental status. EXAM: PORTABLE CHEST 1 VIEW COMPARISON:  Chest x-ray dated June 11, 2013. FINDINGS: The heart size and mediastinal contours are within normal limits. Both lungs are clear. The visualized skeletal structures are unremarkable. IMPRESSION: No active disease. Electronically Signed   By: Obie DredgeWilliam T Derry M.D.   On: 11/09/2017 20:53    Procedures Procedures (including critical care time) CRITICAL CARE Performed by: Arby BarretteMarcy Lakeem Rozo   Total critical care time: 45 minutes  Critical care time was exclusive of separately billable procedures and treating other patients.  Critical care was necessary to treat or prevent imminent or life-threatening deterioration.  Critical care was time spent personally by me on the following activities: development of treatment plan with patient and/or surrogate as well as nursing, discussions with consultants, evaluation of patient's response to treatment, examination of patient, obtaining history from patient or surrogate, ordering and performing treatments and interventions, ordering and review of laboratory studies, ordering and review of radiographic studies, pulse oximetry and re-evaluation of patient's condition. Medications Ordered in ED Medications  0.9 %  sodium chloride infusion (has no administration in time range)  sodium chloride 0.9 % bolus 1,000 mL (0 mLs Intravenous Stopped 11/09/17 2027)     Initial Impression / Assessment and Plan / ED Course  I have reviewed the triage vital signs and the nursing notes.  Pertinent labs & imaging results that were available during my care of the patient were reviewed by me and considered in my medical decision making (see chart for details).      Final Clinical Impressions(s) / ED Diagnoses   Final diagnoses:  Hypernatremia  Acute renal failure, unspecified acute renal  failure type [facility])  Dehydration   Patient sent from nursing home with concern for not eating and drinking.  Report is that that was just a change as of this morning.  Patient is a difficult exam as he can be hostile and threatening to strike.  Reportedly that is a baseline.  At other times the patient is fairly cooperative and gives short, quiet responses.  He has not been trying to get out of the stretcher or showing signs of delirium.  He has intermittently needed soft restraints to facilitate obtaining diagnostic studies and exam for patient safety.  Patient is found to be severely dehydrated with acute renal failure and hypernatremia.  No apparent sign of infection.  Does not have lactic acidosis nor fever nor vital sign instability.  Urinalysis still pending.  Patient had only small amount of bladder.  Will attempt to get specimen by catheter.  Bladder scan does not indicate urinary retention.  Plan for admission. ED Discharge Orders    None       Arby Barrette, MD 11/09/17 2123

## 2017-11-09 NOTE — H&P (Addendum)
History and Physical    Charles Conner NGE:[id number] DOB: 26-Sep-1940 DOA: 11/09/2017  PCP: Patient, No Pcp Per  Patient coming from: Nursing home.  Chief Complaint: Poor oral intake.  History obtained from ER physician as patient is having advanced dementia and no family at the bedside.  HPI: Charles Conner is a 77 y.o. male with history of dementia, possible bipolar disorder, hypertension, hypothyroidism was brought from the nursing home after patient was found to have poor oral intake over the last 24 hours.  There is no mention of any nausea vomiting or diarrhea.  Patient has dementia with sometimes getting aggressive.  ED Course: In the ER patient was at times found to be aggressive trying to kick the staff and had to be placed on restraints.  Patient's creatinine was 3.2 with sodium 116 and anion gap of 19 lactate initially was 1.8 which worsened to 3.7.  EKG was showing normal sinus rhythm chest x-ray abdominal x-ray unremarkable LFT was normal.  Patient was started on fluids for dehydration.  Review of Systems: As per HPI, rest all negative.   Past Medical History:  Diagnosis Date  . Dementia   . Hypothyroidism     History reviewed. No pertinent surgical history.   reports that he has never smoked. He has never used smokeless tobacco. His alcohol and drug histories are not on file.  No Known Allergies  Family History  Family history unknown: Yes    Prior to Admission medications   Medication Sig Start Date End Date Taking? Authorizing Provider  amLODipine (NORVASC) 5 MG tablet Take 5 mg by mouth daily.   Yes [provider]  aspirin EC 81 MG tablet Take 81 mg by mouth daily.   Yes [provider]  calcium-vitamin D (OSCAL WITH D) 500-200 MG-UNIT tablet Take 1 tablet by mouth 2 (two) times daily.   Yes [provider]  divalproex (DEPAKOTE) 250 MG DR tablet Take 250 mg by mouth 2 (two) times daily.   Yes [provider]  docusate sodium  (COLACE) 100 MG capsule Take 100 mg by mouth daily.   Yes [provider]  donepezil (ARICEPT) 10 MG tablet Take 10 mg by mouth at bedtime.   Yes [provider]  folic acid (FOLVITE) 1 MG tablet Take 1 mg by mouth daily.   Yes [provider]  iron polysaccharides (POLY-IRON 150) 150 MG capsule Take 300 mg by mouth daily.   Yes [provider]  levothyroxine (SYNTHROID, LEVOTHROID) 25 MCG tablet Take 25 mcg by mouth daily before breakfast.   Yes [provider]  memantine (NAMENDA) 10 MG tablet Take 10 mg by mouth 2 (two) times daily.   Yes [provider]  mirtazapine (REMERON) 15 MG tablet Take 15 mg by mouth at bedtime.   Yes [provider]  Multiple Vitamins-Minerals (MULTIVITAMIN ADULT) TABS Take 1 tablet by mouth daily.   Yes [provider]  Nutritional Supplements (NUTRITIONAL DRINK PO) Take 118 mLs by mouth 3 (three) times daily.   Yes [provider]  omeprazole (PRILOSEC) 20 MG capsule Take 20 mg by mouth daily.   Yes [provider]  pantoprazole (PROTONIX) 40 MG tablet Take 40 mg by mouth 2 (two) times daily.   Yes [provider]  promethazine (PHENERGAN) 12.5 MG tablet Take 12.5 mg by mouth every 6 (six) hours as needed for nausea or vomiting.   Yes [provider]  tamsulosin (FLOMAX) 0.4 MG CAPS capsule Take 0.4  mg by mouth at bedtime.   Yes [provider]    Physical Exam: Vitals:   11/09/17 1900 11/09/17 1930 11/09/17 2224 11/09/17 2330  BP: (!) 141/102 (!) 140/91 139/89 (!) 148/90  Pulse: 95 71 74 71  Resp:  (!) 29 14 16   Temp:    (!) 97.5 F (36.4 C)  TempSrc:    Axillary  SpO2: 98% 100% 98% 100%  Weight:    43.3 kg (95 lb 8 oz)      Constitutional: Moderately built and nourished. Vitals:   11/09/17 1900 11/09/17 1930 11/09/17 2224 11/09/17 2330  BP: (!) 141/102 (!) 140/91 139/89 (!) 148/90  Pulse: 95 71 74 71  Resp:  (!) 29 14 16   Temp:     (!) 97.5 F (36.4 C)  TempSrc:    Axillary  SpO2: 98% 100% 98% 100%  Weight:    43.3 kg (95 lb 8 oz)   Eyes: Anicteric no pallor. ENMT: No discharge from the ears eyes nose or mouth. Neck: No mass felt.  No neck rigidity.  No JVD appreciated. Respiratory: No rhonchi or crepitations. Cardiovascular: S1-S2 heard no murmurs appreciated. Abdomen: Soft nontender bowel sounds present. Musculoskeletal: No edema.  No joint effusion. Skin: No rash. Neurologic: Alert awake oriented to his name follows commands moves all extremities. Psychiatric: Appears very aggressive at times.   Labs on Admission: I have personally reviewed following labs and imaging studies  CBC: Recent Labs  Lab 11/09/17 1907  WBC 13.8*  NEUTROABS 10.0*  HGB 17.4*  HCT 52.6*  MCV 99.8  PLT 226   Basic Metabolic Panel: Recent Labs  Lab 11/09/17 1907  NA 160*  K 4.7  CL 122*  CO2 19*  GLUCOSE 105*  BUN 81*  CREATININE 3.22*  CALCIUM 9.2   GFR: CrCl cannot be calculated (Unknown ideal weight.). Liver Function Tests: Recent Labs  Lab 11/09/17 1907  AST 15  ALT 13  ALKPHOS 95  BILITOT 1.0  PROT 7.9  ALBUMIN 4.1   Recent Labs  Lab 11/09/17 1907  LIPASE 39   No results for input(s): AMMONIA in the last 168 hours. Coagulation Profile: No results for input(s): INR, PROTIME in the last 168 hours. Cardiac Enzymes: No results for input(s): CKTOTAL, CKMB, CKMBINDEX, TROPONINI in the last 168 hours. BNP (last 3 results) No results for input(s): PROBNP in the last 8760 hours. HbA1C: No results for input(s): HGBA1C in the last 72 hours. CBG: No results for input(s): GLUCAP in the last 168 hours. Lipid Profile: No results for input(s): CHOL, HDL, LDLCALC, TRIG, CHOLHDL, LDLDIRECT in the last 72 hours. Thyroid Function Tests: No results for input(s): TSH, T4TOTAL, FREET4, T3FREE, THYROIDAB in the last 72 hours. Anemia Panel: No results for input(s): VITAMINB12, FOLATE, FERRITIN, TIBC, IRON,  RETICCTPCT in the last 72 hours. Urine analysis: No results found for: COLORURINE, APPEARANCEUR, LABSPEC, PHURINE, GLUCOSEU, HGBUR, BILIRUBINUR, KETONESUR, PROTEINUR, UROBILINOGEN, NITRITE, LEUKOCYTESUR Sepsis Labs: @LABRCNTIP (procalcitonin:4,lacticidven:4) )No results found for this or any previous visit (from the past 240 hour(s)).   Radiological Exams on Admission: Dg Abd 1 View  Result Date: 11/09/2017 CLINICAL DATA:  Refusing oral intake. EXAM: ABDOMEN - 1 VIEW COMPARISON:  None. FINDINGS: The bowel gas pattern is normal. No radio-opaque calculi or other significant radiographic abnormality are seen. Degenerative changes of the lumbar spine. IMPRESSION: Negative. Electronically Signed   By: Obie Dredge M.D.   On: 11/09/2017 20:54   Dg Chest Port 1 View  Result Date: 11/09/2017 CLINICAL DATA:  Altered  mental status. EXAM: PORTABLE CHEST 1 VIEW COMPARISON:  Chest x-ray dated June 11, 2013. FINDINGS: The heart size and mediastinal contours are within normal limits. Both lungs are clear. The visualized skeletal structures are unremarkable. IMPRESSION: No active disease. Electronically Signed   By: Obie DredgeWilliam T Derry M.D.   On: 11/09/2017 20:53    EKG: Independently reviewed.  Normal sinus rhythm with repolarization changes in the lateral leads.  Assessment/Plan Active Problems:   ARF (acute renal failure) (HCC)   Hypernatremia   Anorexia   Dementia   Hypothyroidism   Essential hypertension    1. Acute renal failure with hypernatremia -we do not have old labs to compare.  Suspect his acute renal failure from poor oral intake.  Will check FENa.  Urine does not show any casts.  Continue with hydration I have placed patient on D5W.  Follow metabolic panel lactate levels. 2. Poor oral intake -cause not clear.  Closely observe for now. 3. Hypertension on amlodipine. 4. Hypothyroidism on Synthroid. 5. 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  On restraints.   Patient takes Namenda and Aricept. May need to discuss with next of kin when available to know baseline mental status. As per the SNF nurse patient gets aggressive at times. 6. Possible bipolar disorder on Depakote.  Will check Depakote levels ammonia levels. 7. Leukocytosis likely reactionary.   DVT prophylaxis: SCDs. Code Status: Full code. Family Communication: No family at the bedside. Disposition Plan: Back to facility. Consults called: None. Admission status: Inpatient.   Eduard ClosArshad N Kakrakandy MD [facility] Pager [phone].  If 7PM-7AM, please contact night-coverage [email or web address] Password [email or web address]  11/09/2017, 11:41 PM

## 2017-11-10 ENCOUNTER — Inpatient Hospital Stay (HOSPITAL_COMMUNITY): Payer: Medicare Other

## 2017-11-10 DIAGNOSIS — F0391 Unspecified dementia with behavioral disturbance: Secondary | ICD-10-CM

## 2017-11-10 DIAGNOSIS — I10 Essential (primary) hypertension: Secondary | ICD-10-CM

## 2017-11-10 DIAGNOSIS — E870 Hyperosmolality and hypernatremia: Secondary | ICD-10-CM

## 2017-11-10 DIAGNOSIS — E039 Hypothyroidism, unspecified: Secondary | ICD-10-CM

## 2017-11-10 DIAGNOSIS — N179 Acute kidney failure, unspecified: Principal | ICD-10-CM

## 2017-11-10 LAB — BASIC METABOLIC PANEL
ANION GAP: 10 (ref 5–15)
BUN: 74 mg/dL — ABNORMAL HIGH (ref 8–23)
CO2: 22 mmol/L (ref 22–32)
Calcium: 7.8 mg/dL — ABNORMAL LOW (ref 8.9–10.3)
Chloride: 127 mmol/L — ABNORMAL HIGH (ref 98–111)
Creatinine, Ser: 2.75 mg/dL — ABNORMAL HIGH (ref 0.61–1.24)
GFR, EST AFRICAN AMERICAN: 24 mL/min — AB (ref >60)
GFR, EST NON AFRICAN AMERICAN: 21 mL/min — AB (ref >60)
Glucose, Bld: 171 mg/dL — ABNORMAL HIGH (ref 70–99)
Potassium: 3.9 mmol/L (ref 3.5–5.1)
SODIUM: 159 mmol/L — AB (ref 135–145)

## 2017-11-10 LAB — URINALYSIS, ROUTINE W REFLEX MICROSCOPIC
BILIRUBIN URINE: NEGATIVE
GLUCOSE, UA: NEGATIVE mg/dL
Hgb urine dipstick: NEGATIVE
KETONES UR: 20 mg/dL — AB
Leukocytes, UA: NEGATIVE
Nitrite: NEGATIVE
PROTEIN: NEGATIVE mg/dL
Specific Gravity, Urine: 1.02 (ref 1.005–1.030)
pH: 5 (ref 5.0–8.0)

## 2017-11-10 LAB — CBC WITH DIFFERENTIAL/PLATELET
BASOS ABS: 0 10*3/uL (ref 0.0–0.1)
BASOS PCT: 0 %
EOS ABS: 0.3 10*3/uL (ref 0.0–0.7)
EOS PCT: 2 %
HEMATOCRIT: 41 % (ref 39.0–52.0)
Hemoglobin: 13.3 g/dL (ref 13.0–17.0)
Lymphocytes Relative: 27 %
Lymphs Abs: 3.2 10*3/uL (ref 0.7–4.0)
MCH: 32.2 pg (ref 26.0–34.0)
MCHC: 32.4 g/dL (ref 30.0–36.0)
MCV: 99.3 fL (ref 78.0–100.0)
MONO ABS: 0.8 10*3/uL (ref 0.1–1.0)
MONOS PCT: 7 %
NEUTROS ABS: 7.8 10*3/uL — AB (ref 1.7–7.7)
Neutrophils Relative %: 64 %
PLATELETS: 165 10*3/uL (ref 150–400)
RBC: 4.13 MIL/uL — ABNORMAL LOW (ref 4.22–5.81)
RDW: 15.5 % (ref 11.5–15.5)
WBC: 12.1 10*3/uL — ABNORMAL HIGH (ref 4.0–10.5)

## 2017-11-10 LAB — HEPATIC FUNCTION PANEL
ALT: 10 U/L (ref 0–44)
AST: 14 U/L — ABNORMAL LOW (ref 15–41)
Albumin: 3.1 g/dL — ABNORMAL LOW (ref 3.5–5.0)
Alkaline Phosphatase: 72 U/L (ref 38–126)
Bilirubin, Direct: 0.1 mg/dL (ref 0.0–0.2)
Indirect Bilirubin: 0.6 mg/dL (ref 0.3–0.9)
TOTAL PROTEIN: 5.9 g/dL — AB (ref 6.5–8.1)
Total Bilirubin: 0.7 mg/dL (ref 0.3–1.2)

## 2017-11-10 LAB — LACTIC ACID, PLASMA
LACTIC ACID, VENOUS: 1 mmol/L (ref 0.5–1.9)
LACTIC ACID, VENOUS: 1.3 mmol/L (ref 0.5–1.9)

## 2017-11-10 LAB — T4, FREE: FREE T4: 1.45 ng/dL (ref 0.82–1.77)

## 2017-11-10 LAB — VALPROIC ACID LEVEL: Valproic Acid Lvl: 28 ug/mL — ABNORMAL LOW (ref 50.0–100.0)

## 2017-11-10 LAB — TSH: TSH: 1.681 u[IU]/mL (ref 0.350–4.500)

## 2017-11-10 LAB — MRSA PCR SCREENING: MRSA by PCR: NEGATIVE

## 2017-11-10 MED ORDER — HYDRALAZINE HCL 20 MG/ML IJ SOLN: 10.0000 mg | Freq: Four times a day (QID) | INTRAMUSCULAR | Status: DC | PRN

## 2017-11-10 NOTE — Progress Notes (Signed)
Pt. has not voided this shift. Bladder scan showed 60cc urine then re scanned multiple times >200 cc, unclear of accurate urine per scan. Straight cath preformed 225 cc urine collected.

## 2017-11-10 NOTE — Progress Notes (Signed)
Pt arrived to unit and was slightly agitated when moving him to the bed. Reapplied soft restraints due to aggressive behavior. After completing assessment and VS, he rested comfortably. Removed his left wrist restraint to take medications. Is resting comfortably and in NAD. Will continue reassessing the need for soft restraints.

## 2017-11-10 NOTE — Progress Notes (Signed)
PROGRESS NOTE  Charles MannsDavid Buhl JYN:[id number] DOB: 02/16/1941 DOA: 11/09/2017 PCP: Patient, No Pcp Per  HPI/Recap of past 824 hours: 77 year old male with history of dementia, bipolar disorder, hypertension, hypothyroidism, presented to the ER from the nursing home after patient was found to have poor oral intake for the past few days.  Of note, history was obtained from ER physician as patient has advanced dementia and there was no family at bedside.  In the ED, patient noted to be intermittently aggressive and had to be placed on restraints.  Patient was found to have AKI, with hyponatremia and elevated lactic acid on presentation.  Chest and abdominal x-ray were unremarkable.  Patient admitted for further management.  Today, patient reported he was fine, [place].  Upon trying to examine patient, patient swung at me while on restraints.  Unable to assess ROS  Assessment/Plan: Active Problems:   ARF (acute renal failure) (HCC)   Hypernatremia   Anorexia   Dementia   Hypothyroidism   Essential hypertension  Acute renal failure/hypernatremia Likely due to poor oral intake/dehydration ??  Likely underlying CKD, no baseline to compare, ?  Urinary retention UA negative, UC pending Continue with IV hydration, D5W Renal ultrasound pending Avoid nephrotoxic's, monitor I's and O's Daily BMP  Failure to thrive/malnutrition/poor oral intake Likely due to ?dementia, no obvious signs of infection UA negative, UC pending Abdominal and chest x-ray both unremarkable Continue IV fluids Dietitian consulted SLP consulted  Leukocytosis Afebrile, ??reactive UA negative, UC pending Abdominal and chest x-ray both unremarkable Daily CBC, monitor closely  Lactic acidosis Resolved status post IV fluids Likely due to dehydration  ??Acute urinary retention Hx of BPH Straight cath as needed Continue tamsulosin   Hypertension Continue amlodipine  Hypothyroidism Will  check TSH, free T4 Continue Synthroid  Dementia with behavioral disturbances Noted to be aggressive, on restraints Continue Aricept, namenda  Bipolar disorder Noted to be aggressive, on restraints Depakote level low Continue Depakote, mirtazapine  GERD Continue pantoprazole  GOC Palliative consulted         Code Status: Full  Family Communication: None at bedside  Disposition Plan: Back to SNF   Consultants:  Palliative consulted  Procedures:  None  Antimicrobials:  None  DVT prophylaxis: SCDs   Objective: Vitals:   11/09/17 2224 11/09/17 2330 11/10/17 0528 11/10/17 1039  BP: 139/89 (!) 148/90 (!) 167/89 (!) 160/80  Pulse: 74 71 77   Resp: 14 16 20    Temp:  (!) 97.5 F (36.4 C) (!) 97.4 F (36.3 C)   TempSrc:  Axillary    SpO2: 98% 100% 100%   Weight:  43.3 kg (95 lb 8 oz)      Intake/Output Summary (Last 24 hours) at 11/10/2017 1136 Last data filed at 11/10/2017 0415 Gross per 24 hour  Intake 436.67 ml  Output 350 ml  Net 86.67 ml   Filed Weights   11/09/17 2330  Weight: 43.3 kg (95 lb 8 oz)    Exam:   General: NAD, intermittently agitated, cachectic  Cardiovascular: S1, S2 present  Respiratory: CTA B  Abdomen: Soft, nontender, nondistended, bowel sounds present  Musculoskeletal: No pedal edema bilaterally  Skin: Normal  Psychiatry: Aggressive intermittently   Data Reviewed: CBC: Recent Labs  Lab 11/09/17 1907  WBC 13.8*  NEUTROABS 10.0*  HGB 17.4*  HCT 52.6*  MCV 99.8  PLT 226   Basic Metabolic Panel: Recent Labs  Lab 11/09/17 1907 11/10/17 0337  NA 160* 159*  K 4.7  3.9  CL 122* 127*  CO2 19* 22  GLUCOSE 105* 171*  BUN 81* 74*  CREATININE 3.22* 2.75*  CALCIUM 9.2 7.8*   GFR: CrCl cannot be calculated (Unknown ideal weight.). Liver Function Tests: Recent Labs  Lab 11/09/17 1907 11/10/17 0337  AST 15 14*  ALT 13 10  ALKPHOS 95 72  BILITOT 1.0 0.7  PROT 7.9 5.9*  ALBUMIN 4.1 3.1*   Recent  Labs  Lab 11/09/17 1907  LIPASE 39   No results for input(s): AMMONIA in the last 168 hours. Coagulation Profile: No results for input(s): INR, PROTIME in the last 168 hours. Cardiac Enzymes: No results for input(s): CKTOTAL, CKMB, CKMBINDEX, TROPONINI in the last 168 hours. BNP (last 3 results) No results for input(s): PROBNP in the last 8760 hours. HbA1C: No results for input(s): HGBA1C in the last 72 hours. CBG: No results for input(s): GLUCAP in the last 168 hours. Lipid Profile: No results for input(s): CHOL, HDL, LDLCALC, TRIG, CHOLHDL, LDLDIRECT in the last 72 hours. Thyroid Function Tests: No results for input(s): TSH, T4TOTAL, FREET4, T3FREE, THYROIDAB in the last 72 hours. Anemia Panel: No results for input(s): VITAMINB12, FOLATE, FERRITIN, TIBC, IRON, RETICCTPCT in the last 72 hours. Urine analysis:    Component Value Date/Time   COLORURINE YELLOW 11/10/2017 0404   APPEARANCEUR CLEAR 11/10/2017 0404   LABSPEC 1.020 11/10/2017 0404   PHURINE 5.0 11/10/2017 0404   GLUCOSEU NEGATIVE 11/10/2017 0404   HGBUR NEGATIVE 11/10/2017 0404   BILIRUBINUR NEGATIVE 11/10/2017 0404   KETONESUR 20 (A) 11/10/2017 0404   PROTEINUR NEGATIVE 11/10/2017 0404   NITRITE NEGATIVE 11/10/2017 0404   LEUKOCYTESUR NEGATIVE 11/10/2017 0404   Sepsis Labs: @LABRCNTIP (procalcitonin:4,lacticidven:4)  ) Recent Results (from the past 240 hour(s))  MRSA PCR Screening     Status: None   Collection Time: 11/10/17 12:02 AM  Result Value Ref Range Status   MRSA by PCR NEGATIVE NEGATIVE Final    Comment:        The GeneXpert MRSA Assay (FDA approved for NASAL specimens only), is one component of a comprehensive MRSA colonization surveillance program. It is not intended to diagnose MRSA infection nor to guide or monitor treatment for MRSA infections. Performed at [facility], [address], [place], [place] [address]       Studies: Dg Abd 1 View  Result Date:  11/09/2017 CLINICAL DATA:  Refusing oral intake. EXAM: ABDOMEN - 1 VIEW COMPARISON:  None. FINDINGS: The bowel gas pattern is normal. No radio-opaque calculi or other significant radiographic abnormality are seen. Degenerative changes of the lumbar spine. IMPRESSION: Negative. Electronically Signed   By: Obie Dredge M.D.   On: 11/09/2017 20:54   Dg Chest Port 1 View  Result Date: 11/09/2017 CLINICAL DATA:  Altered mental status. EXAM: PORTABLE CHEST 1 VIEW COMPARISON:  Chest x-ray dated June 11, 2013. FINDINGS: The heart size and mediastinal contours are within normal limits. Both lungs are clear. The visualized skeletal structures are unremarkable. IMPRESSION: No active disease. Electronically Signed   By: Obie Dredge M.D.   On: 11/09/2017 20:53    Scheduled Meds: . amLODipine  5 mg Oral Daily  . aspirin EC  81 mg Oral Daily  . divalproex  250 mg Oral BID  . docusate sodium  100 mg Oral Daily  . donepezil  10 mg Oral QHS  . feeding supplement (ENSURE ENLIVE)   Oral TID BM  . folic acid  1 mg Oral Daily  . iron polysaccharides  300  mg Oral Daily  . levothyroxine  25 mcg Oral QAC breakfast  . memantine  10 mg Oral BID  . mirtazapine  15 mg Oral QHS  . multivitamin with minerals  1 tablet Oral Daily  . pantoprazole  40 mg Oral BID  . tamsulosin  0.4 mg Oral QHS    Continuous Infusions: . sodium chloride Stopped (11/10/17 0015)  . dextrose 100 mL/hr at 11/10/17 1046     LOS: 1 day     Briant Cedar, MD [facility]  If 7PM-7AM, please contact night-coverage [email or web address] Password [facility] 11/10/2017, 11:36 AM

## 2017-11-10 NOTE — Progress Notes (Addendum)
Pt had not voided via condom cath since arriving to the unit. ED nurse reported he was bladder scanned in ED and it showed 161ml and was not straight cathed. Bladder scanned at 0400 and showed >36230ml. In and out cath- 350ml collected. Will continue to monitor.

## 2017-11-11 DIAGNOSIS — Z515 Encounter for palliative care: Secondary | ICD-10-CM

## 2017-11-11 LAB — CBC WITH DIFFERENTIAL/PLATELET
Basophils Absolute: 0 10*3/uL (ref 0.0–0.1)
Basophils Relative: 0 %
Eosinophils Absolute: 0.3 10*3/uL (ref 0.0–0.7)
Eosinophils Relative: 2 %
HEMATOCRIT: 40.4 % (ref 39.0–52.0)
HEMOGLOBIN: 13.1 g/dL (ref 13.0–17.0)
LYMPHS ABS: 2.3 10*3/uL (ref 0.7–4.0)
LYMPHS PCT: 19 %
MCH: 31.9 pg (ref 26.0–34.0)
MCHC: 32.4 g/dL (ref 30.0–36.0)
MCV: 98.3 fL (ref 78.0–100.0)
MONOS PCT: 8 %
Monocytes Absolute: 0.9 10*3/uL (ref 0.1–1.0)
NEUTROS ABS: 8.4 10*3/uL — AB (ref 1.7–7.7)
Neutrophils Relative %: 71 %
Platelets: 140 10*3/uL — ABNORMAL LOW (ref 150–400)
RBC: 4.11 MIL/uL — ABNORMAL LOW (ref 4.22–5.81)
RDW: 14.9 % (ref 11.5–15.5)
WBC: 11.9 10*3/uL — ABNORMAL HIGH (ref 4.0–10.5)

## 2017-11-11 LAB — BASIC METABOLIC PANEL
Anion gap: 8 (ref 5–15)
BUN: 36 mg/dL — AB (ref 8–23)
CHLORIDE: 113 mmol/L — AB (ref 98–111)
CO2: 26 mmol/L (ref 22–32)
Calcium: 8.2 mg/dL — ABNORMAL LOW (ref 8.9–10.3)
Creatinine, Ser: 1.56 mg/dL — ABNORMAL HIGH (ref 0.61–1.24)
GFR calc non Af Amer: 41 mL/min — ABNORMAL LOW (ref >60)
GFR, EST AFRICAN AMERICAN: 47 mL/min — AB (ref >60)
GLUCOSE: 99 mg/dL (ref 70–99)
Potassium: 2.9 mmol/L — ABNORMAL LOW (ref 3.5–5.1)
Sodium: 147 mmol/L — ABNORMAL HIGH (ref 135–145)

## 2017-11-11 LAB — URINE CULTURE: Culture: NO GROWTH

## 2017-11-11 MED ORDER — POTASSIUM CHLORIDE 10 MEQ/100ML IV SOLN
10.0000 meq | INTRAVENOUS | Status: AC
  Administered 2017-11-11 (×4): 10 meq via INTRAVENOUS
  Filled 2017-11-11 (×2): qty 100

## 2017-11-11 MED ORDER — DEXTROSE-NACL 5-0.45 % IV SOLN
INTRAVENOUS | Status: DC
  Administered 2017-11-11: 11:00:00 via INTRAVENOUS

## 2017-11-11 NOTE — Evaluation (Signed)
SLP Cancellation Note  Patient Details Name: Octavio MannsDavid Sparkman MRN: [id number] DOB: 01/23/1941   Cancelled treatment:       Reason Eval/Treat Not Completed: Other (comment)(note pt refusing ALL po intake and medications today and pallative seeing pt, will follow up next date.   Donavan Burnetamara Karnisha Lefebre, MS [facility] SLP [phone]  Chales AbrahamsKimball, Devanny Palecek Ann 11/11/2017, 7:25 PM

## 2017-11-11 NOTE — Consult Note (Signed)
Consultation Note Date: 11/11/2017   Patient Name: Charles Conner  DOB: January 03, 1941  MRN: [id number]  Age / Sex: 77 y.o., male  PCP: Patient, No Pcp Per Referring Physician: Alma Friendly, MD  Reason for Consultation: Establishing goals of care  HPI/Patient Profile: 77 y.o. 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 Admitted from the emergency department where he presented from skilled nursing facility, now admitted to hospital medicine service for acute renal failure, hyper nature anemia, failure to thrive, malnutrition, poor oral intake, aggressive behavior/behavioral disturbances.  Patient has been placed on IV fluids, IV potassium replacement. Refusing oral medications refusing any food or drink. Palliative consultation for goals of care discussions.  Patient is resting in bed with eyes open. His friend/family members former neighbors are present at the bedside. He engages with them periodically, will not verbalize with me. I met with patient's healthcare power of attorney release Charles Conner, Mr. Charles Conner wife and Mr. Charles Conner's daughter. They state that they know the patient very well and Mr. Charles Conner is his healthcare power of attorney agent.  I introduced myself and palliative care as follows: Palliative medicine is specialized medical care for people living with  serious illness. It focuses on providing relief from the symptoms and stress of a serious illness. The goal is to improve quality of life for both the patient and the family.  We discussed about patient's current medical conditions, current hospitalization course. We discussed about dementia disease trajectory of decline. Goals wishes and values discussed. CODE STATUS discussions undertaken. Mr. Charles Conner states that the patient's wife died 2 years ago and at the time of her death had clearly stated that she would not want to be kept alive by artificial means. Mr. Charles Conner recalls that the patient had also stated that at his end of life he would not want to be kept alive by artificial means. We discussed extensively about scope of code versus DO NOT RESUSCITATE/DO NOT INTUBATE. We discussed about establishing DO NOT RESUSCITATE/DO NOT INTUBATE, continuing gentle medical measures for now, and we also discussed aboutno PEG tube.   In addition, introduced and discussed in detail about hospice philosophy of care. Hosp[facility] services following at [facility] versus the type of care that can be provided in a residential hospice setting both options discussed in detail all of the family's questions answered to the best of my ability. See additional discussions/recommendations below. Thank you for the consult.  [place] close friend/neighbor [phone], [phone]  SUMMARY OF RECOMMENDATIONS  after discussion with healthcare power of attorney agent, CODE STATUS now established as DO NOT RESUSCITATE/DO NOT INTUBATE Monitor hospital course. If the patient has some degree of stabilization/recovery then consider back to [facility] with hospice services following otherwise, would recommend residential hospice facility for comfort care measures. This has been discussed frankly with patient's power of attorney Mr. Charles Conner who was present at the bedside.   Code Status/Advance Care  Planning:  DNR    Symptom Management:    continue current mode of care  Palliative Prophylaxis:   Aspiration   Psycho-social/Spiritual:   Desire for further Chaplaincy support:yes  Additional Recommendations: Education on Hospice  Prognosis:   < 2 weeks  Discharge Planning: Hospice facility      Primary Diagnoses: Present on Admission: . ARF (acute renal failure) ([place]) . Dementia . Hypothyroidism . Essential hypertension   I have reviewed the medical record, interviewed the patient and family, and examined the patient. The following aspects are pertinent.  Past Medical History:  Diagnosis Date  . Dementia   . Hypothyroidism    Social History   Socioeconomic History  . Marital status: Widowed    Spouse name: Not on file  . Number of children: Not on file  . Years of education: Not on file  . Highest education level: Not on file  Occupational History  . Not on file  Social Needs  . Financial resource strain: Not on file  . Food insecurity:    Worry: Not on file    Inability: Not on file  . Transportation needs:    Medical: Not on file    Non-medical: Not on file  Tobacco Use  . Smoking status: Never Smoker  . Smokeless tobacco: Never Used  Substance and Sexual Activity  . Alcohol use: Not on file  . Drug use: Not on file  . Sexual activity: Not on file  Lifestyle  . Physical activity:    Days per week: Not on file    Minutes per session: Not on file  . Stress: Not on file  Relationships  . Social connections:    Talks on phone: Not on file    Gets together: Not on file    Attends religious service: Not on file    Active member of club or organization: Not on file    Attends meetings of clubs or organizations: Not on file    Relationship status: Not on file  Other Topics Concern  . Not on file  Social History Narrative  . Not on file   Family History  Family history unknown: Yes   Scheduled Meds: . amLODipine  5 mg Oral Daily  .  aspirin EC  81 mg Oral Daily  . divalproex  250 mg Oral BID  . docusate sodium  100 mg Oral Daily  . donepezil  10 mg Oral QHS  . feeding supplement (ENSURE ENLIVE)   Oral TID BM  . folic acid  1 mg Oral Daily  . iron polysaccharides  300 mg Oral Daily  . levothyroxine  25 mcg Oral QAC breakfast  . memantine  10 mg Oral BID  . mirtazapine  15 mg Oral QHS  . multivitamin with minerals  1 tablet Oral Daily  . pantoprazole  40 mg Oral BID  . tamsulosin  0.4 mg Oral QHS   Continuous Infusions: . dextrose 5 % and 0.45% NaCl 100 mL/hr at 11/11/17 1110  . potassium chloride 10 mEq (11/11/17 1340)   PRN Meds:.acetaminophen **OR**  acetaminophen, hydrALAZINE, ondansetron **OR** ondansetron (ZOFRAN) IV, promethazine Medications Prior to Admission:  Prior to Admission medications   Medication Sig Start Date End Date Taking? Authorizing Provider  amLODipine (NORVASC) 5 MG tablet Take 5 mg by mouth daily.   Yes [provider]  aspirin EC 81 MG tablet Take 81 mg by mouth daily.   Yes [provider]  calcium-vitamin D (OSCAL WITH D) 500-200 MG-UNIT tablet Take 1 tablet by mouth 2 (two) times daily.   Yes [provider]  divalproex (DEPAKOTE) 250 MG DR tablet Take 250 mg by mouth 2 (two) times daily.   Yes [provider]  docusate sodium (COLACE) 100 MG capsule Take 100 mg by mouth daily.   Yes [provider]  donepezil (ARICEPT) 10 MG tablet Take 10 mg by mouth at bedtime.   Yes [provider]  folic acid (FOLVITE) 1 MG tablet Take 1 mg by mouth daily.   Yes [provider]  iron polysaccharides (POLY-IRON 150) 150 MG capsule Take 300 mg by mouth daily.   Yes [provider]  levothyroxine (SYNTHROID, LEVOTHROID) 25 MCG tablet Take 25 mcg by mouth daily before breakfast.   Yes [provider]  memantine (NAMENDA) 10 MG tablet Take 10 mg by mouth 2 (two) times daily.   Yes [provider]  mirtazapine  (REMERON) 15 MG tablet Take 15 mg by mouth at bedtime.   Yes [provider]  Multiple Vitamins-Minerals (MULTIVITAMIN ADULT) TABS Take 1 tablet by mouth daily.   Yes [provider]  Nutritional Supplements (NUTRITIONAL DRINK PO) Take 118 mLs by mouth 3 (three) times daily.   Yes [provider]  omeprazole (PRILOSEC) 20 MG capsule Take 20 mg by mouth daily.   Yes [provider]  pantoprazole (PROTONIX) 40 MG tablet Take 40 mg by mouth 2 (two) times daily.   Yes [provider]  promethazine (PHENERGAN) 12.5 MG tablet Take 12.5 mg by mouth every 6 (six) hours as needed for nausea or vomiting.   Yes [provider]  tamsulosin (FLOMAX) 0.4 MG CAPS capsule Take 0.4 mg by mouth at bedtime.   Yes [provider]   No Known Allergies Review of Systems Non verbal to me, does speak some with his friends at bedside at times.   Physical Exam Disheveled, elderly appearing gentleman Agitated at times Cachectic Breathing appears regular, labored at times Abdomen is not distended Patient does not have edema Intermittently aggressive especially towards staff requiring soft restraints  Vital Signs: BP (!) 167/93 (BP Location: Left Arm)   Pulse 72   Temp 98.2 F (36.8 C) (Oral)   Resp 18   Wt 46.2 kg (101 lb 13.6 oz)   SpO2 99%  Pain Scale: 0-10   Pain Score: 0-No pain   SpO2: SpO2: 99 % O2 Device:SpO2: 99 % O2 Flow Rate: .   IO: Intake/output summary:   Intake/Output Summary (Last 24 hours) at 11/11/2017 1411 Last data filed at 11/11/2017 1219 Gross per 24 hour  Intake 1440 ml  Output 1200 ml  Net 240 ml    LBM: Last BM Date: 11/09/17 Baseline Weight: Weight: 43.3 kg (95 lb 8 oz) Most recent weight: Weight: 46.2 kg (101 lb 13.6 oz)     Palliative Assessment/Data:   PPS 20%  Time In:  12 Time Out:  13.10 Time Total:  70 min  Greater than 50%  of this time was spent counseling and coordinating care related to the  above assessment and plan.  Signed by: Loistine Chance, MD [phone]   Please contact Palliative Medicine Team phone at [phone] for questions and concerns.  For individual provider: See Shea Evans

## 2017-11-11 NOTE — Progress Notes (Signed)
PROGRESS NOTE  Charles Conner AVW:[id number] DOB: 03/29/1941 DOA: 11/09/2017 PCP: Patient, No Pcp Per  HPI/Recap of past 5524 hours: 77 year old male with history of dementia, bipolar disorder, hypertension, hypothyroidism, presented to the ER from the nursing home after patient was found to have poor oral intake for the past few days.  Of note, history was obtained from ER physician as patient has advanced dementia and there was no family at bedside.  In the ED, patient noted to be intermittently aggressive and had to be placed on restraints.  Patient was found to have AKI, with hyponatremia and elevated lactic acid on presentation.  Chest and abdominal x-ray were unremarkable.  Patient admitted for further management.  Today, patient refusing to speak with me. Unable to assess ROS  Assessment/Plan: Active Problems:   ARF (acute renal failure) (HCC)   Hypernatremia   Anorexia   Dementia   Hypothyroidism   Essential hypertension  Acute renal failure/hypernatremia Improving Likely due to poor oral intake/dehydration/acute urinary retention ??  Likely underlying CKD, no baseline to compare, ?  Urinary retention UA negative, UC no growth Continue with IV hydration, D5W/1/2NS Renal ultrasound with normal right kidney.  No right hydronephrosis. Left kidney not visualized with technical limitations as above.   Avoid nephrotoxic's, monitor I's and O's Daily BMP  Failure to thrive/malnutrition/poor oral intake Likely due to ?dementia, no obvious signs of infection UA negative, UC no growth Abdominal and chest x-ray both unremarkable Continue IV fluids Dietitian consulted SLP consulted  Leukocytosis Afebrile, ??reactive UA negative, UC no growth Abdominal and chest x-ray both unremarkable Daily CBC, monitor closely  Lactic acidosis Resolved status post IV fluids Likely due to dehydration  ??Acute urinary retention Hx of BPH Foley placed Continue tamsulosin    Hypertension Continue amlodipine  Hypothyroidism TSH, free T4 WNL Continue Synthroid  Dementia with behavioral disturbances Noted to be aggressive, on restraints Continue Aricept, namenda  Bipolar disorder Noted to be aggressive, on restraints Depakote level low Continue Depakote, mirtazapine  GERD Continue pantoprazole  GOC Palliative consulted: had a meeting with POA, changed code status to DNR, hospice upon discharge      Code Status: DNR  Family Communication: None at bedside  Disposition Plan: Hospice   Consultants:  Palliative consulted  Procedures:  None  Antimicrobials:  None  DVT prophylaxis: SCDs   Objective: Vitals:   11/10/17 2045 11/11/17 0433 11/11/17 0500 11/11/17 1509  BP: (!) 154/92 (!) 167/93  (!) 170/89  Pulse: 75 72  86  Resp: 16 18  20   Temp: 97.9 F (36.6 C) 98.2 F (36.8 C)  98.6 F (37 C)  TempSrc: Oral Oral    SpO2: 100% 99%  100%  Weight:   46.2 kg (101 lb 13.6 oz)     Intake/Output Summary (Last 24 hours) at 11/11/2017 1743 Last data filed at 11/11/2017 1440 Gross per 24 hour  Intake 2026.67 ml  Output 1200 ml  Net 826.67 ml   Filed Weights   11/09/17 2330 11/11/17 0500  Weight: 43.3 kg (95 lb 8 oz) 46.2 kg (101 lb 13.6 oz)    Exam:   General: NAD, intermittently agitated, cachectic  Cardiovascular: S1, S2 present  Respiratory: CTA B  Abdomen: Soft, nontender, nondistended, bowel sounds present  Musculoskeletal: No pedal edema bilaterally  Skin: Normal  Psychiatry: Aggressive intermittently   Data Reviewed: CBC: Recent Labs  Lab 11/09/17 1907 11/10/17 1239 11/11/17 0525  WBC 13.8* 12.1* 11.9*  NEUTROABS 10.0* 7.8* 8.4*  HGB 17.4* 13.3 13.1  HCT 52.6* 41.0 40.4  MCV 99.8 99.3 98.3  PLT 226 165 140*   Basic Metabolic Panel: Recent Labs  Lab 11/09/17 1907 11/10/17 0337 11/11/17 0525  NA 160* 159* 147*  K 4.7 3.9 2.9*  CL 122* 127* 113*  CO2 19* 22 26  GLUCOSE 105* 171* 99   BUN 81* 74* 36*  CREATININE 3.22* 2.75* 1.56*  CALCIUM 9.2 7.8* 8.2*   GFR: CrCl cannot be calculated (Unknown ideal weight.). Liver Function Tests: Recent Labs  Lab 11/09/17 1907 11/10/17 0337  AST 15 14*  ALT 13 10  ALKPHOS 95 72  BILITOT 1.0 0.7  PROT 7.9 5.9*  ALBUMIN 4.1 3.1*   Recent Labs  Lab 11/09/17 1907  LIPASE 39   No results for input(s): AMMONIA in the last 168 hours. Coagulation Profile: No results for input(s): INR, PROTIME in the last 168 hours. Cardiac Enzymes: No results for input(s): CKTOTAL, CKMB, CKMBINDEX, TROPONINI in the last 168 hours. BNP (last 3 results) No results for input(s): PROBNP in the last 8760 hours. HbA1C: No results for input(s): HGBA1C in the last 72 hours. CBG: No results for input(s): GLUCAP in the last 168 hours. Lipid Profile: No results for input(s): CHOL, HDL, LDLCALC, TRIG, CHOLHDL, LDLDIRECT in the last 72 hours. Thyroid Function Tests: Recent Labs    11/10/17 1242  TSH 1.681  FREET4 1.45   Anemia Panel: No results for input(s): VITAMINB12, FOLATE, FERRITIN, TIBC, IRON, RETICCTPCT in the last 72 hours. Urine analysis:    Component Value Date/Time   COLORURINE YELLOW 11/10/2017 0404   APPEARANCEUR CLEAR 11/10/2017 0404   LABSPEC 1.020 11/10/2017 0404   PHURINE 5.0 11/10/2017 0404   GLUCOSEU NEGATIVE 11/10/2017 0404   HGBUR NEGATIVE 11/10/2017 0404   BILIRUBINUR NEGATIVE 11/10/2017 0404   KETONESUR 20 (A) 11/10/2017 0404   PROTEINUR NEGATIVE 11/10/2017 0404   NITRITE NEGATIVE 11/10/2017 0404   LEUKOCYTESUR NEGATIVE 11/10/2017 0404   Sepsis Labs: @LABRCNTIP (procalcitonin:4,lacticidven:4)  ) Recent Results (from the past 240 hour(s))  Culture, blood (routine x 2)     Status: None (Preliminary result)   Collection Time: 11/09/17  7:08 PM  Result Value Ref Range Status   Specimen Description   Final    BLOOD LEFT ANTECUBITAL Performed at [facility], [address], [place],  [place] [address]    Special Requests   Final    BOTTLES DRAWN AEROBIC AND ANAEROBIC Blood Culture adequate volume Performed at [facility], [address], [place], [place] [address]    Culture   Final    NO GROWTH < 24 HOURS Performed at [facility], [address], [place], [place] [address]    Report Status PENDING  Incomplete  Culture, blood (routine x 2)     Status: None (Preliminary result)   Collection Time: 11/09/17  7:08 PM  Result Value Ref Range Status   Specimen Description   Final    BLOOD RIGHT ANTECUBITAL Performed at [facility], [address] [place], [place] [address]    Special Requests   Final    BOTTLES DRAWN AEROBIC AND ANAEROBIC Blood Culture adequate volume Performed at [facility], [address], [place], [place] [address]    Culture   Final    NO GROWTH < 24 HOURS Performed at [facility], [address], [place], [place] [address]    Report Status PENDING  Incomplete  MRSA PCR Screening     Status: None  Collection Time: 11/10/17 12:02 AM  Result Value Ref Range Status   MRSA by PCR NEGATIVE NEGATIVE Final    Comment:        The GeneXpert MRSA Assay (FDA approved for NASAL specimens only), is one component of a comprehensive MRSA colonization surveillance program. It is not intended to diagnose MRSA infection nor to guide or monitor treatment for MRSA infections. Performed at [facility], [address], [place], [place] [address]   Urine culture     Status: None   Collection Time: 11/10/17  4:04 AM  Result Value Ref Range Status   Specimen Description   Final    URINE, CATHETERIZED Performed at [facility], [address] [place], [place] [address]    Special Requests   Final    NONE Performed at [facility], [address], [place], [place] [address]    Culture   Final    NO GROWTH Performed at [facility], [address] [place], [place] [address]    Report Status 11/11/2017 FINAL  Final      Studies: No results found.  Scheduled Meds: . amLODipine  5 mg Oral Daily  . aspirin EC  81 mg Oral Daily  . divalproex  250 mg Oral BID  . docusate sodium  100 mg Oral Daily  . donepezil  10 mg Oral QHS  . feeding supplement (ENSURE ENLIVE)   Oral TID BM  . folic acid  1 mg Oral Daily  . iron polysaccharides  300 mg Oral Daily  . levothyroxine  25 mcg Oral QAC breakfast  . memantine  10 mg Oral BID  . mirtazapine  15 mg Oral QHS  . multivitamin with minerals  1 tablet Oral Daily  . pantoprazole  40 mg Oral BID  . tamsulosin  0.4 mg Oral QHS    Continuous Infusions: . dextrose 5 % and 0.45% NaCl 100 mL/hr at 11/11/17 1110     LOS: 2 days     Briant Cedar, MD [facility]  If 7PM-7AM, please contact night-coverage [email or web address] Password [facility] 11/11/2017, 5:43 PM

## 2017-11-11 NOTE — Progress Notes (Signed)
Initial Nutrition Assessment  INTERVENTION:   Continue Ensure Enlive po BID, each supplement provides 350 kcal and 20 grams of protein  NUTRITION DIAGNOSIS:   Inadequate oral intake related to (dementia) as evidenced by meal completion < 25%.  GOAL:   Patient will meet greater than or equal to 90% of their needs  MONITOR:   PO intake, Supplement acceptance, Labs, Weight trends, I & O's  REASON FOR ASSESSMENT:   Consult Assessment of nutrition requirement/status  ASSESSMENT:   77 year old male with history of dementia, bipolar disorder, hypertension, hypothyroidism, presented to the ER from the nursing home after patient was found to have poor oral intake for the past few days.  Patient with advanced dementia, currently in restraints for aggressive behavior. Pt refusing to eat and not taking medications today. Pt has been ordered Ensure supplements, will continue in hopes pt will eventually accept.  No height recorded in system. No weight loss in weight records.   Palliative care consulted for GOC.   Medications: Remeron tablet daily, Multivitamin with minerals daily, D5 -.45% NaCl infusion at 100 ml/hr -provides 408 kcal Labs reviewed: Elevated Na  Low K  NUTRITION - FOCUSED PHYSICAL EXAM:  Deferred given pt's mental status and currently in restraints.  Diet Order:   Diet Order           Diet Heart Room service appropriate? Yes; Fluid consistency: Thin  Diet effective now          EDUCATION NEEDS:   Not appropriate for education at this time  Skin:  Skin Assessment: Reviewed RN Assessment  Last BM:  7/12  Height:   Ht Readings from Last 1 Encounters:  No data found for Ht    Weight:   Wt Readings from Last 1 Encounters:  11/11/17 101 lb 13.6 oz (46.2 kg)    Ideal Body Weight:  (unable to calculate)  BMI:  Unable to calculate  Estimated Nutritional Needs:   Kcal:  1200-1400  Protein:  55-65g  Fluid:  1.5L/day  Charles FrancoLindsey Wlliam Grosso, MS, RD,  LDN [facility] Clinical Dietitian Pager: [phone] After Hours Pager: [phone]

## 2017-11-12 DIAGNOSIS — R630 Anorexia: Secondary | ICD-10-CM

## 2017-11-12 LAB — BASIC METABOLIC PANEL
Anion gap: 8 (ref 5–15)
BUN: 21 mg/dL (ref 8–23)
CHLORIDE: 110 mmol/L (ref 98–111)
CO2: 25 mmol/L (ref 22–32)
CREATININE: 1.28 mg/dL — AB (ref 0.61–1.24)
Calcium: 7.9 mg/dL — ABNORMAL LOW (ref 8.9–10.3)
GFR, EST NON AFRICAN AMERICAN: 52 mL/min — AB (ref >60)
Glucose, Bld: 117 mg/dL — ABNORMAL HIGH (ref 70–99)
POTASSIUM: 3.3 mmol/L — AB (ref 3.5–5.1)
SODIUM: 143 mmol/L (ref 135–145)

## 2017-11-12 LAB — CBC WITH DIFFERENTIAL/PLATELET
BASOS PCT: 0 %
Basophils Absolute: 0 10*3/uL (ref 0.0–0.1)
EOS ABS: 0.2 10*3/uL (ref 0.0–0.7)
EOS PCT: 2 %
HCT: 39.6 % (ref 39.0–52.0)
HEMOGLOBIN: 13.1 g/dL (ref 13.0–17.0)
LYMPHS ABS: 2.2 10*3/uL (ref 0.7–4.0)
Lymphocytes Relative: 21 %
MCH: 31.8 pg (ref 26.0–34.0)
MCHC: 33.1 g/dL (ref 30.0–36.0)
MCV: 96.1 fL (ref 78.0–100.0)
Monocytes Absolute: 0.8 10*3/uL (ref 0.1–1.0)
Monocytes Relative: 7 %
NEUTROS PCT: 70 %
Neutro Abs: 7.4 10*3/uL (ref 1.7–7.7)
PLATELETS: 125 10*3/uL — AB (ref 150–400)
RBC: 4.12 MIL/uL — AB (ref 4.22–5.81)
RDW: 14.7 % (ref 11.5–15.5)
WBC: 10.5 10*3/uL (ref 4.0–10.5)

## 2017-11-12 NOTE — Progress Notes (Signed)
PROGRESS NOTE  Charles Conner ZOX:[id number] DOB: Jul 14, 1940 DOA: 11/09/2017 PCP: Patient, No Pcp Per  HPI/Recap of past 20 hours: 77 year old male with history of dementia, bipolar disorder, hypertension, hypothyroidism, presented to the ER from the nursing home after patient was found to have poor oral intake for the past few days.  Of note, history was obtained from ER physician as patient has advanced dementia and there was no family at bedside.  In the ED, patient noted to be intermittently aggressive and had to be placed on restraints.  Patient was found to have AKI, with hyponatremia and elevated lactic acid on presentation.  Chest and abdominal x-ray were unremarkable.  Patient admitted for further management.  Today, patient still refusing to speak with me. Unable to assess ROS. Plan to transfer to residential hospice  Assessment/Plan: Active Problems:   ARF (acute renal failure) (HCC)   Hypernatremia   Anorexia   Dementia   Hypothyroidism   Essential hypertension  Acute renal failure/hypernatremia Improving Likely due to poor oral intake/dehydration/acute urinary retention ??  Likely underlying CKD, no baseline to compare, ?  Urinary retention UA negative, UC no growth Continue with IV hydration, D5W/1/2NS Renal ultrasound with normal right kidney.  No right hydronephrosis. Left kidney not visualized with technical limitations as above.   Avoid nephrotoxic's, monitor I's and O's Daily BMP  Failure to thrive/malnutrition/poor oral intake Likely due to ?dementia, no obvious signs of infection UA negative, UC no growth Abdominal and chest x-ray both unremarkable Continue IV fluids Dietitian consulted SLP consulted  Leukocytosis Resolved Afebrile, ??reactive UA negative, UC no growth Abdominal and chest x-ray both unremarkable Daily CBC, monitor closely  Lactic acidosis Resolved status post IV fluids Likely due to dehydration  ??Acute urinary retention Hx of  BPH Foley placed Continue tamsulosin   Hypertension Refusing amlodipine, start IV hydralazine prn  Hypothyroidism TSH, free T4 WNL Continue Synthroid  Dementia with behavioral disturbances Noted to be aggressive, on restraints Continue Aricept, namenda  Bipolar disorder Noted to be aggressive, on restraints Depakote level low Continue Depakote, mirtazapine  GERD Continue pantoprazole  GOC Palliative consulted: had a meeting with POA, changed code status to DNR, plan for residential hospice      Code Status: DNR  Family Communication: Spoke with POA  Disposition Plan: Residential Hospice   Consultants:  Palliative   Procedures:  None  Antimicrobials:  None  DVT prophylaxis: SCDs   Objective: Vitals:   11/11/17 2054 11/12/17 0500 11/12/17 0607 11/12/17 1603  BP: (!) 178/87  (!) 151/71 (!) 151/78  Pulse: 73  63 83  Resp: 20  20 (!) 24  Temp: 98.8 F (37.1 C)  98.1 F (36.7 C) 98.7 F (37.1 C)  TempSrc: Oral  Oral Oral  SpO2: 95%  100% 98%  Weight:  50.3 kg (110 lb 14.3 oz)      Intake/Output Summary (Last 24 hours) at 11/12/2017 1842 Last data filed at 11/12/2017 1329 Gross per 24 hour  Intake -  Output 1675 ml  Net -1675 ml   Filed Weights   11/09/17 2330 11/11/17 0500 11/12/17 0500  Weight: 43.3 kg (95 lb 8 oz) 46.2 kg (101 lb 13.6 oz) 50.3 kg (110 lb 14.3 oz)    Exam:   General: NAD, intermittently agitated, cachectic  Cardiovascular: S1, S2 present  Respiratory: CTA B  Abdomen: Soft, nontender, nondistended, bowel sounds present  Musculoskeletal: No pedal edema bilaterally  Skin: Normal  Psychiatry: Aggressive intermittently   Data Reviewed: CBC: Recent Labs  Lab 11/09/17 1907 11/10/17 1239 11/11/17 0525 11/12/17 0549  WBC 13.8* 12.1* 11.9* 10.5  NEUTROABS 10.0* 7.8* 8.4* 7.4  HGB 17.4* 13.3 13.1 13.1  HCT 52.6* 41.0 40.4 39.6  MCV 99.8 99.3 98.3 96.1  PLT 226 165 140* 125*   Basic Metabolic Panel: Recent  Labs  Lab 11/09/17 1907 11/10/17 0337 11/11/17 0525 11/12/17 0549  NA 160* 159* 147* 143  K 4.7 3.9 2.9* 3.3*  CL 122* 127* 113* 110  CO2 19* 22 26 25   GLUCOSE 105* 171* 99 117*  BUN 81* 74* 36* 21  CREATININE 3.22* 2.75* 1.56* 1.28*  CALCIUM 9.2 7.8* 8.2* 7.9*   GFR: CrCl cannot be calculated (Unknown ideal weight.). Liver Function Tests: Recent Labs  Lab 11/09/17 1907 11/10/17 0337  AST 15 14*  ALT 13 10  ALKPHOS 95 72  BILITOT 1.0 0.7  PROT 7.9 5.9*  ALBUMIN 4.1 3.1*   Recent Labs  Lab 11/09/17 1907  LIPASE 39   No results for input(s): AMMONIA in the last 168 hours. Coagulation Profile: No results for input(s): INR, PROTIME in the last 168 hours. Cardiac Enzymes: No results for input(s): CKTOTAL, CKMB, CKMBINDEX, TROPONINI in the last 168 hours. BNP (last 3 results) No results for input(s): PROBNP in the last 8760 hours. HbA1C: No results for input(s): HGBA1C in the last 72 hours. CBG: No results for input(s): GLUCAP in the last 168 hours. Lipid Profile: No results for input(s): CHOL, HDL, LDLCALC, TRIG, CHOLHDL, LDLDIRECT in the last 72 hours. Thyroid Function Tests: Recent Labs    11/10/17 1242  TSH 1.681  FREET4 1.45   Anemia Panel: No results for input(s): VITAMINB12, FOLATE, FERRITIN, TIBC, IRON, RETICCTPCT in the last 72 hours. Urine analysis:    Component Value Date/Time   COLORURINE YELLOW 11/10/2017 0404   APPEARANCEUR CLEAR 11/10/2017 0404   LABSPEC 1.020 11/10/2017 0404   PHURINE 5.0 11/10/2017 0404   GLUCOSEU NEGATIVE 11/10/2017 0404   HGBUR NEGATIVE 11/10/2017 0404   BILIRUBINUR NEGATIVE 11/10/2017 0404   KETONESUR 20 (A) 11/10/2017 0404   PROTEINUR NEGATIVE 11/10/2017 0404   NITRITE NEGATIVE 11/10/2017 0404   LEUKOCYTESUR NEGATIVE 11/10/2017 0404   Sepsis Labs: @LABRCNTIP (procalcitonin:4,lacticidven:4)  ) Recent Results (from the past 240 hour(s))  Culture, blood (routine x 2)     Status: None (Preliminary result)    Collection Time: 11/09/17  7:08 PM  Result Value Ref Range Status   Specimen Description   Final    BLOOD LEFT ANTECUBITAL Performed at [facility], [address], [place], [place] [address]    Special Requests   Final    BOTTLES DRAWN AEROBIC AND ANAEROBIC Blood Culture adequate volume Performed at [facility], [address], [place], [place] [address]    Culture   Final    NO GROWTH 3 DAYS Performed at [facility], [address] [place], [place] [address]    Report Status PENDING  Incomplete  Culture, blood (routine x 2)     Status: None (Preliminary result)   Collection Time: 11/09/17  7:08 PM  Result Value Ref Range Status   Specimen Description   Final    BLOOD RIGHT ANTECUBITAL Performed at [facility], [address], [place], [place] [address]    Special Requests   Final    BOTTLES DRAWN AEROBIC AND ANAEROBIC Blood Culture adequate volume Performed at [facility], [address] [place], [place] [address]    Culture  Final    NO GROWTH 3 DAYS Performed at [facility], [address] [place], [place] [address]    Report Status PENDING  Incomplete  MRSA PCR Screening     Status: None   Collection Time: 11/10/17 12:02 AM  Result Value Ref Range Status   MRSA by PCR NEGATIVE NEGATIVE Final    Comment:        The GeneXpert MRSA Assay (FDA approved for NASAL specimens only), is one component of a comprehensive MRSA colonization surveillance program. It is not intended to diagnose MRSA infection nor to guide or monitor treatment for MRSA infections. Performed at [facility], [address] [place], [place] [address]   Urine culture     Status: None   Collection Time: 11/10/17  4:04 AM  Result Value Ref Range Status   Specimen Description   Final    URINE, CATHETERIZED Performed at [facility], [address]  [place], [place] [address]    Special Requests   Final    NONE Performed at [facility], [address] [place], [place] [address]    Culture   Final    NO GROWTH Performed at [facility], [address] [place], [place] [address]    Report Status 11/11/2017 FINAL  Final      Studies: No results found.  Scheduled Meds: . divalproex  250 mg Oral BID  . donepezil  10 mg Oral QHS  . feeding supplement (ENSURE ENLIVE)   Oral TID BM  . levothyroxine  25 mcg Oral QAC breakfast  . memantine  10 mg Oral BID  . mirtazapine  15 mg Oral QHS  . pantoprazole  40 mg Oral BID  . tamsulosin  0.4 mg Oral QHS    Continuous Infusions: . dextrose 5 % and 0.45% NaCl 100 mL/hr at 11/11/17 1110     LOS: 3 days     Briant CedarNkeiruka J Terren Jandreau, MD [facility]  If 7PM-7AM, please contact night-coverage [email or web address] Password [email or web address] 11/12/2017, 6:42 PM

## 2017-11-12 NOTE — Progress Notes (Signed)
Pt refused all morning medications.

## 2017-11-12 NOTE — Progress Notes (Signed)
PMT consult note.  Patient appears weak, pale, not eating. Does not open eyes, does not verbalize.   Brother, who is visiting from [place] is at the bedside. The patient's brother has confirmed that [name] is the patient's power of attorney, patient's brother has discussed with poa about the patient's ongoing decline, about DNR DNI being established, as well as about next steps being for addition of hospice services.   BP (!) 151/71 (BP Location: Right Arm)   Pulse 63   Temp 98.1 F (36.7 C) (Oral)   Resp 20   Wt 50.3 kg (110 lb 14.3 oz)   SpO2 100%  Labs and imaging noted.   Not awake not alert Does not respond Does not follow commands Shallow regular breathing Regular No edema Abdomen is not distended Appears weak and disheveled.   Assessment/Plan: AKI Hypernatremia Failure to thrive Functional decline  Discussed with [facility] MD Dr. Sharolyn DouglasEzenduka, agree with residential hospice Agree with CSW consult to help facilitate Prognosis less than 2 weeks.  25 minutes spent Rosalin HawkingZeba Cathalina Barcia MD [facility] palliative medicine team [phone]  [phone]

## 2017-11-12 NOTE — Progress Notes (Addendum)
[facility]  Received request from CSW [place] for [facility]. Chart reviewed, attempted contact with Mr. Jeanella AntonReece but no answer and no opportunity to leave message. Made CSW aware. Will continue to attempt contact this afternoon and follow up in am as well.   Addendum - spoke with [facility] by phone. He will call me back after speaking with his wife to confirm plan.   Thank you,  Forrestine Himva Davis, LCSW [phone]

## 2017-11-12 NOTE — Progress Notes (Signed)
SLP Cancellation Note  Patient Details Name: Charles Conner MRN: [id number] DOB: 06/07/1940   Cancelled treatment:       Reason Eval/Treat Not Completed: Patient declined, no reason specified. Pt refused POs and oral care offered. Per chart review and discussion with RN it appears as though he may be pursuing residential hospice. Will f/u as indicated, although if pt is choosing full comfort care, may wish to consider any comfort feeds, particularly since pt does not seem to ask for much.   Charles Conner, Charles Conner 11/12/2017, 3:11 PM  Charles Conner, M.A. CCC-SLP [phone]

## 2017-11-12 NOTE — Care Management Important Message (Signed)
Important Message  Patient Details  Name: Charles Conner MRN: [id number] Date of Birth: 08/31/1940   Medicare Important Message Given:  Yes    Caren MacadamFuller, Trini Soldo 11/12/2017, 12:19 PMImportant Message  Patient Details  Name: Charles MannsDavid Conner MRN: [id number] Date of Birth: 11/12/1940   Medicare Important Message Given:  Yes    Caren MacadamFuller, Omarri Eich 11/12/2017, 12:19 PM

## 2017-11-12 NOTE — Progress Notes (Signed)
This shift pt had periods of agitation but did take scheduled medications as requested and allowed for this RN to complete assessment. Will continue to monitor the need for current wrist restraints.

## 2017-11-13 DIAGNOSIS — R627 Adult failure to thrive: Secondary | ICD-10-CM

## 2017-11-13 NOTE — Progress Notes (Signed)
[facility] available for transfer today. Met with patient's friend/POA to complete paper work for transfer today. Dr. Orpah Melter to assume care per [facility] request.   Please send discharge summary to [phone].  RN please call report to [phone].  Thank you,  Erling Conte LCSW [phone]

## 2017-11-13 NOTE — Clinical Social Work Note (Signed)
Clinical Social Work Assessment  Patient Details  Name: Charles Conner MRN: [id number] Date of Birth: 05/21/1940  Date of referral:  11/13/17               Reason for consult:  Facility Placement                Permission sought to share information with:  Case Manager, Magazine features editoracility Contact Representative, Family Supports Permission granted to share information::  (Patient not oriented)  Name::     Charles Conner  Agency::  Residential Hospice  Relationship::  HCPOA  Contact Information:     Housing/Transportation Living arrangements for the past 2 months:  [facility] Source of Information:  Power of Attorney Patient Interpreter Needed:  None Criminal Activity/Legal Involvement Pertinent to Current Situation/Hospitalization:  No - Comment as needed Significant Relationships:  Merchandiser, retailCommunity Support, Siblings, Friend Lives with:  Facility Resident Do you feel safe going back to the place where you live?  Yes Need for family participation in patient care:  Yes (Comment)  Care giving concerns:  No care giving concerns at the time of assessment.    Social Worker assessment / plan:  LCSW consulted for residential hospice placement.   [facility].   Patient's brother and patient's HCPOA are both agreeable to residential hospice at [facility].   [facility]   Employment status:  Retired Health and safety inspectornsurance information:  Medicare PT Recommendations:  Not assessed at this time Information / Referral to community resources:     Patient/Family's Response to care:  Brother and HCPOA are thankful for LCSW coordination with dc planning.   Patient/Family's Understanding of and Emotional Response to Diagnosis, Current Treatment, and Prognosis:  Family and HCPOA feel that [facility] is the best place to keep patient comfortable at this time.   Emotional Assessment Appearance:  Appears stated age Attitude/Demeanor/Rapport:   Unable to Assess Affect (typically observed):  Unable to Assess Orientation:  Oriented to Self Alcohol / Substance use:  Not Applicable Psych involvement (Current and /or in the community):  No (Comment)  Discharge Needs  Concerns to be addressed:  No discharge needs identified Readmission within the last 30 days:  No Current discharge risk:  None Barriers to Discharge:  No Barriers Identified   Charles HellingBernette Eileen Croswell, LCSW 11/13/2017, 2:30 PM

## 2017-11-13 NOTE — Progress Notes (Signed)
LCSW following for residential hospice placement.   [facility]   LCSW faxed dc docs to facility.   Patient to transport by PTAR.  RN please call report to [phone].  Beulah GandyBernette Terez Freimark, LSCW [facility] [phone]

## 2017-11-13 NOTE — Progress Notes (Signed)
LCSW received [place] [facility] stating that patient's POA wanted [facility] to [facility]. LCSW notified Scott that LCSW would need to discuss with POA.   LCSW called POA that stated the [facility] as he feels he would get the care that  patient needs.

## 2017-11-13 NOTE — Progress Notes (Signed)
Report called to [facility] at this time.  IV discontinued. Pt updated.  Will await transportation.  Ardyth GalAnderson, Pattricia Weiher Ann, RN 11/13/2017

## 2017-11-13 NOTE — Discharge Summary (Signed)
Discharge Summary  Charles Conner YNW:[id number] DOB: 1941/03/23  PCP: Patient, No Pcp Per  Admit date: 11/09/2017 Discharge date: 11/13/2017  Time spent: 30 mins   Recommendations for Outpatient Follow-up:  1. Hospice care  Discharge Diagnoses:  Active Hospital Problems   Diagnosis Date Noted  . ARF (acute renal failure) (HCC) 11/09/2017  . Hypernatremia 11/09/2017  . Anorexia 11/09/2017  . Dementia 11/09/2017  . Hypothyroidism 11/09/2017  . Essential hypertension 11/09/2017    Resolved Hospital Problems  No resolved problems to display.    Discharge Condition: Stable  Diet recommendation: Regular   Vitals:   11/13/17 0413 11/13/17 0547  BP: (!) 168/94 (!) 159/81  Pulse: 72 75  Resp: 19   Temp: 98 F (36.7 C)   SpO2: 98%     History of present illness:  77 year old male with history of dementia, bipolar disorder, hypertension, hypothyroidism, presented to the ER from the nursing home after patient was found to have poor oral intake for the past few days.  Of note, history was obtained from ER physician as patient has advanced dementia and there was no family at bedside.  In the ED, patient noted to be intermittently aggressive and had to be placed on restraints.  Patient was found to have AKI, with hyponatremia and elevated lactic acid on presentation.  Chest and abdominal x-ray were unremarkable.  Patient admitted for further management.  Today, patient asking for food, stated he was hungry. Denied any new complaints. Transfer to [facility].   Hospital Course:  Active Problems:   ARF (acute renal failure) (HCC)   Hypernatremia   Anorexia   Dementia   Hypothyroidism   Essential hypertension  Acute renal failure/hypernatremia Improved Likely due to poor oral intake/dehydration/acute urinary retention ??  Likely underlying CKD, no baseline to compare, ?  Urinary retention UA negative, UC no growth S/P IV hydration Renal ultrasound with normal right kidney.  No right hydronephrosis. Left kidney not visualized with technical limitations as above  Failure to thrive/malnutrition/poor oral intake Likely due to ?dementia, no obvious signs of infection UA negative, UC no growth Abdominal and chest x-ray both unremarkable Dietitian, SLP consulted  Leukocytosis Resolved Afebrile, ??reactive UA negative, UC no growth Abdominal and chest x-ray both unremarkable Daily CBC, monitor closely  Lactic acidosis Resolved status post IV fluids Likely due to dehydration  ??Acute urinary retention Hx of BPH Foley placed Continue tamsulosin   Hypertension On amlodipine  Hypothyroidism TSH, free T4 WNL On synthroid  Dementia with behavioral disturbances Noted to be aggressive, on restraints Continue Aricept, namenda  Bipolar disorder Noted to be aggressive, on restraints Depakote level low Continue Depakote, mirtazapine  GERD Continue pantoprazole  GOC Palliative consulted: had a meeting with POA, changed code status to DNR, transfer to [facility] place      Procedures:   None  Consultations:  Palliative team  Discharge Exam: BP (!) 159/81   Pulse 75   Temp 98 F (36.7 C) (Oral)   Resp 19   Wt 50.2 kg (110 lb 10.7 oz)   SpO2 98%   General: NAD, intermittently agitated, cachetic Cardiovascular: S1, S2 present Respiratory: CTAB   Discharge Instructions You were cared for by a hospitalist during your hospital stay. If you have any questions about your discharge medications or the care you received while you were in the hospital after you are discharged, you can call the unit and asked to speak with the hospitalist on call if the hospitalist that took care of you is  not available. Once you are discharged, your primary care physician will handle any further medical issues. Please note that NO REFILLS for any discharge medications will be authorized once you are discharged, as it is imperative that you return to your  primary care physician (or establish a relationship with a primary care physician if you do not have one) for your aftercare needs so that they can reassess your need for medications and monitor your lab values.  Discharge Instructions    Diet - low sodium heart healthy   Complete by:  As directed    Increase activity slowly   Complete by:  As directed      Allergies as of 11/13/2017   No Known Allergies     Medication List    STOP taking these medications   aspirin EC 81 MG tablet   calcium-vitamin D 500-200 MG-UNIT tablet Commonly known as:  OSCAL WITH D   folic acid 1 MG tablet Commonly known as:  FOLVITE   MULTIVITAMIN ADULT Tabs   NUTRITIONAL DRINK PO   omeprazole 20 MG capsule Commonly known as:  PRILOSEC   POLY-IRON 150 150 MG capsule Generic drug:  iron polysaccharides   promethazine 12.5 MG tablet Commonly known as:  PHENERGAN     TAKE these medications   amLODipine 5 MG tablet Commonly known as:  NORVASC Take 5 mg by mouth daily.   divalproex 250 MG DR tablet Commonly known as:  DEPAKOTE Take 250 mg by mouth 2 (two) times daily.   docusate sodium 100 MG capsule Commonly known as:  COLACE Take 100 mg by mouth daily.   donepezil 10 MG tablet Commonly known as:  ARICEPT Take 10 mg by mouth at bedtime.   levothyroxine 25 MCG tablet Commonly known as:  SYNTHROID, LEVOTHROID Take 25 mcg by mouth daily before breakfast.   memantine 10 MG tablet Commonly known as:  NAMENDA Take 10 mg by mouth 2 (two) times daily.   mirtazapine 15 MG tablet Commonly known as:  REMERON Take 15 mg by mouth at bedtime.   pantoprazole 40 MG tablet Commonly known as:  PROTONIX Take 40 mg by mouth 2 (two) times daily.   tamsulosin 0.4 MG Caps capsule Commonly known as:  FLOMAX Take 0.4 mg by mouth at bedtime.      No Known Allergies    The results of significant diagnostics from this hospitalization (including imaging, microbiology, ancillary and laboratory)  are listed below for reference.    Significant Diagnostic Studies: Dg Abd 1 View  Result Date: 11/09/2017 CLINICAL DATA:  Refusing oral intake. EXAM: ABDOMEN - 1 VIEW COMPARISON:  None. FINDINGS: The bowel gas pattern is normal. No radio-opaque calculi or other significant radiographic abnormality are seen. Degenerative changes of the lumbar spine. IMPRESSION: Negative. Electronically Signed   By: Obie Dredge M.D.   On: 11/09/2017 20:54   US Renal  Result Date: 11/10/2017 CLINICAL DATA:  77 y/o  M; acute kidney injury. EXAM: RENAL / URINARY TRACT ULTRASOUND COMPLETE COMPARISON:  None. FINDINGS: Right Kidney: Length: 9.9 cm. Echogenicity within normal limits. No mass or hydronephrosis visualized. Left Kidney: Not visualized. Patient could not be repositioned due to restraints and patient combative. Bladder: Appears normal for degree of bladder distention. IMPRESSION: 1. Normal right kidney.  No right hydronephrosis. 2. Left kidney not visualized with technical limitations as above. Electronically Signed   By: Mitzi Hansen M.D.   On: 11/10/2017 19:06   Dg Chest Port 1 View  Result Date: 11/09/2017  CLINICAL DATA:  Altered mental status. EXAM: PORTABLE CHEST 1 VIEW COMPARISON:  Chest x-ray dated June 11, 2013. FINDINGS: The heart size and mediastinal contours are within normal limits. Both lungs are clear. The visualized skeletal structures are unremarkable. IMPRESSION: No active disease. Electronically Signed   By: Obie Dredge M.D.   On: 11/09/2017 20:53    Microbiology: Recent Results (from the past 240 hour(s))  Culture, blood (routine x 2)     Status: None (Preliminary result)   Collection Time: 11/09/17  7:08 PM  Result Value Ref Range Status   Specimen Description   Final    BLOOD LEFT ANTECUBITAL Performed at [facility], [address] [place], [place] [address]    Special Requests   Final    BOTTLES DRAWN AEROBIC AND ANAEROBIC Blood Culture  adequate volume Performed at [facility], [address] [place], [place] [address]    Culture   Final    NO GROWTH 3 DAYS Performed at [facility], [address], [place], [place] [address]    Report Status PENDING  Incomplete  Culture, blood (routine x 2)     Status: None (Preliminary result)   Collection Time: 11/09/17  7:08 PM  Result Value Ref Range Status   Specimen Description   Final    BLOOD RIGHT ANTECUBITAL Performed at [facility], [address] [place], [place] [address]    Special Requests   Final    BOTTLES DRAWN AEROBIC AND ANAEROBIC Blood Culture adequate volume Performed at [facility], [address], [place], [place] [address]    Culture   Final    NO GROWTH 3 DAYS Performed at [facility], [address] [place], [place] [address]    Report Status PENDING  Incomplete  MRSA PCR Screening     Status: None   Collection Time: 11/10/17 12:02 AM  Result Value Ref Range Status   MRSA by PCR NEGATIVE NEGATIVE Final    Comment:        The GeneXpert MRSA Assay (FDA approved for NASAL specimens only), is one component of a comprehensive MRSA colonization surveillance program. It is not intended to diagnose MRSA infection nor to guide or monitor treatment for MRSA infections. Performed at [facility], [address] [place], [place] [address]   Urine culture     Status: None   Collection Time: 11/10/17  4:04 AM  Result Value Ref Range Status   Specimen Description   Final    URINE, CATHETERIZED Performed at [facility], [address] [place], [place] [address]    Special Requests   Final    NONE Performed at [facility], [address], [place], [place] [address]    Culture   Final    NO GROWTH Performed at [facility], [address] [place], [place] [address]    Report Status 11/11/2017 FINAL  Final       Labs: Basic Metabolic Panel: Recent Labs  Lab 11/09/17 1907 11/10/17 0337 11/11/17 0525 11/12/17 0549  NA 160* 159* 147* 143  K 4.7 3.9 2.9* 3.3*  CL 122* 127* 113* 110  CO2 19* 22 26 25   GLUCOSE 105* 171* 99 117*  BUN 81* 74* 36* 21  CREATININE 3.22* 2.75* 1.56* 1.28*  CALCIUM 9.2 7.8* 8.2* 7.9*   Liver Function Tests: Recent Labs  Lab 11/09/17 1907 11/10/17 0337  AST 15  14*  ALT 13 10  ALKPHOS 95 72  BILITOT 1.0 0.7  PROT 7.9 5.9*  ALBUMIN 4.1 3.1*   Recent Labs  Lab 11/09/17 1907  LIPASE 39   No results for input(s): AMMONIA in the last 168 hours. CBC: Recent Labs  Lab 11/09/17 1907 11/10/17 1239 11/11/17 0525 11/12/17 0549  WBC 13.8* 12.1* 11.9* 10.5  NEUTROABS 10.0* 7.8* 8.4* 7.4  HGB 17.4* 13.3 13.1 13.1  HCT 52.6* 41.0 40.4 39.6  MCV 99.8 99.3 98.3 96.1  PLT 226 165 140* 125*   Cardiac Enzymes: No results for input(s): CKTOTAL, CKMB, CKMBINDEX, TROPONINI in the last 168 hours. BNP: BNP (last 3 results) Recent Labs    11/09/17 1908  BNP 48.9    ProBNP (last 3 results) No results for input(s): PROBNP in the last 8760 hours.  CBG: No results for input(s): GLUCAP in the last 168 hours.     Signed:  Briant CedarNkeiruka J Ezenduka, MD [facility] 11/13/2017, 12:03 PM

## 2017-11-13 NOTE — Progress Notes (Signed)
This shift pt refused all nursing care and medications. Screaming and hollering for this RN to leave room and not touch him. Only able to complete assessments for restraint care. Will continue to monitor

## 2017-11-13 NOTE — Progress Notes (Signed)
SLP to sign off as plan is for pt to discharge to hospice.   Donavan Burnetamara Erynn Vaca, MS [facility] SLP [phone]

## 2017-11-13 NOTE — Progress Notes (Signed)
[facility]. Pt in stable condition, A&O to self, calm and cooperative.  Ardyth GalAnderson, Audryanna Zurita Ann, RN 11/13/2017

## 2017-11-14 LAB — CULTURE, BLOOD (ROUTINE X 2)
CULTURE: NO GROWTH
Culture: NO GROWTH
SPECIAL REQUESTS: ADEQUATE
Special Requests: ADEQUATE

## 2017-11-29 DEATH — deceased

## 2019-04-18 IMAGING — US US RENAL
1 series · 14 of 25 positions shown · non-contrast
Comparison: None.

CLINICAL DATA: 77 y/o  M; acute kidney injury.

EXAM:
RENAL / URINARY TRACT ULTRASOUND COMPLETE

[Series 1: us renal · 14 of 26 slices shown]
[im 1/26]
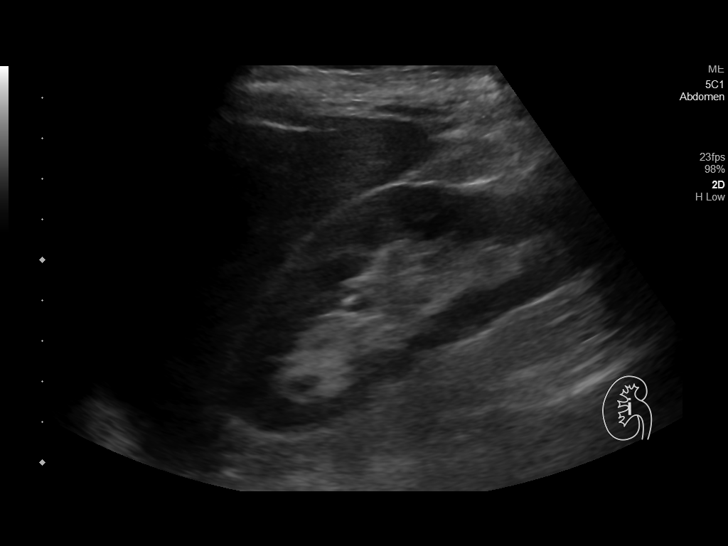
[im 3/26]
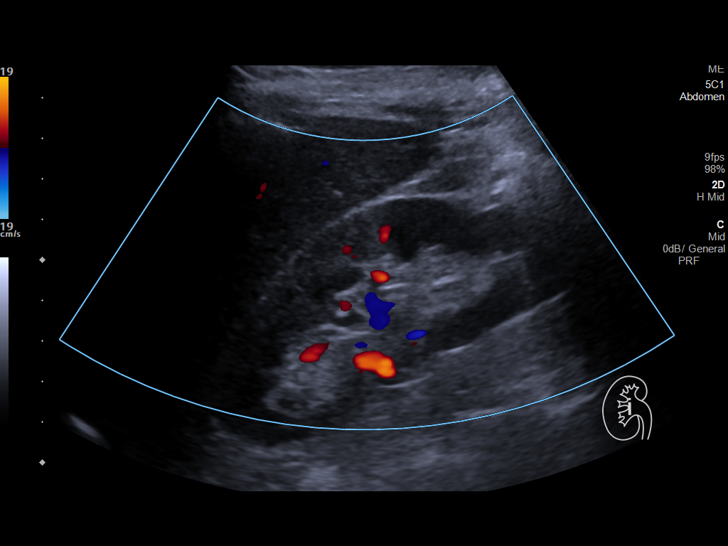
[im 5/26]
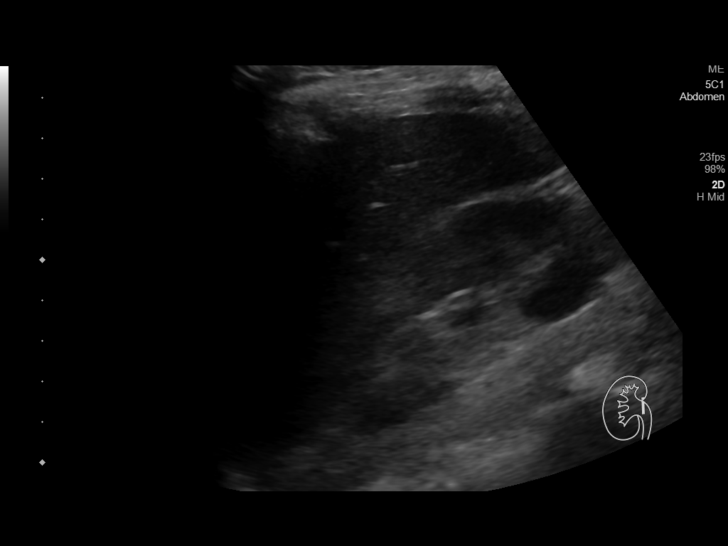
[im 7/26]
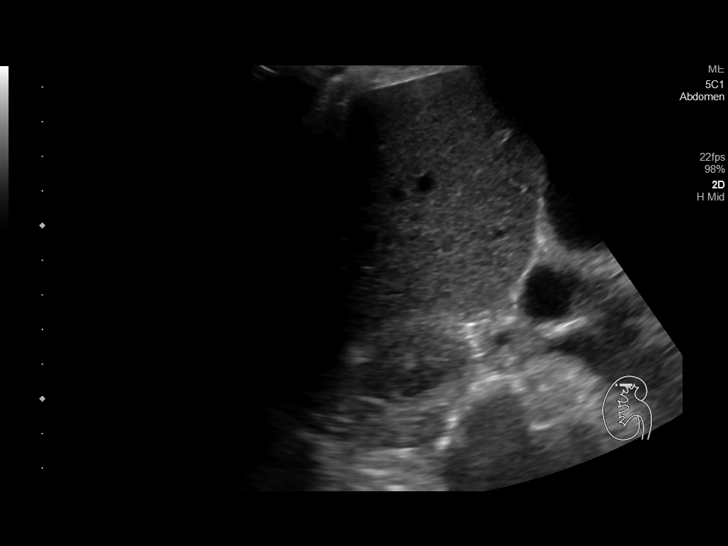
[im 9/26]
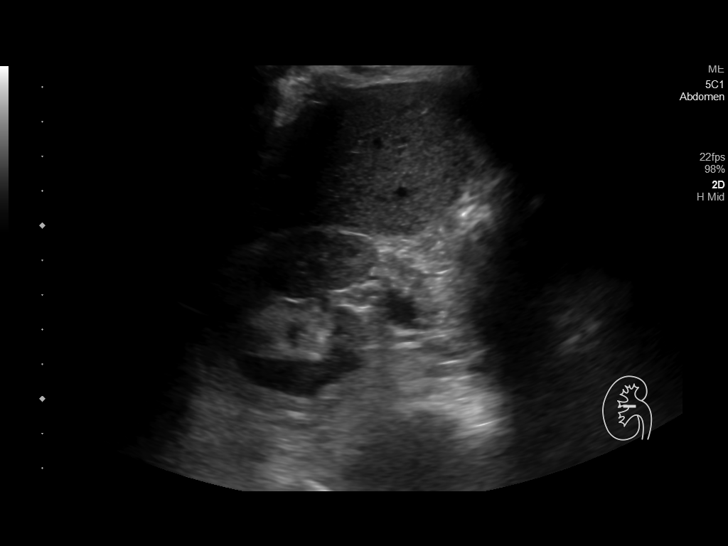
[im 10/26]
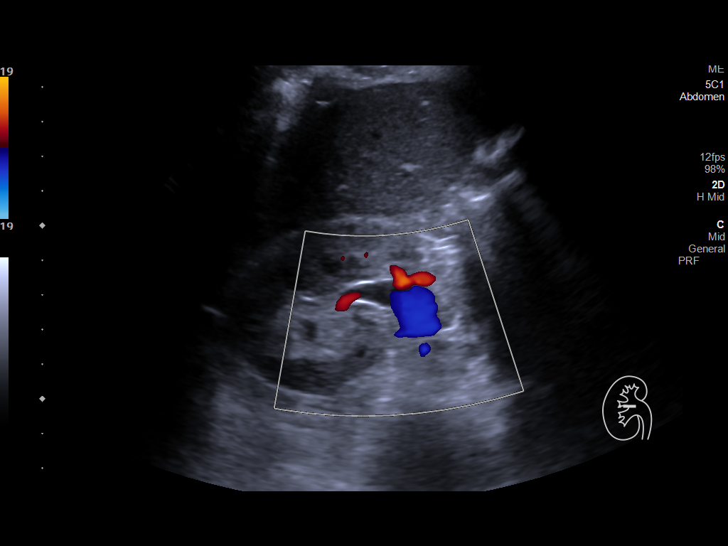
[im 12/26]
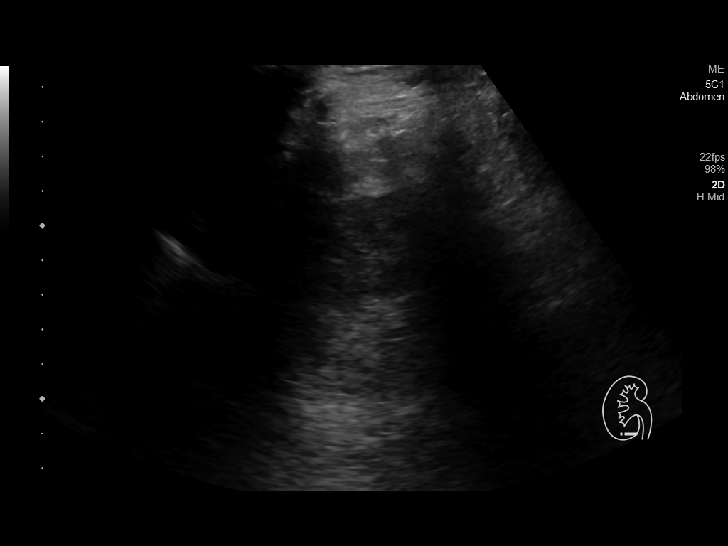
[im 14/26]
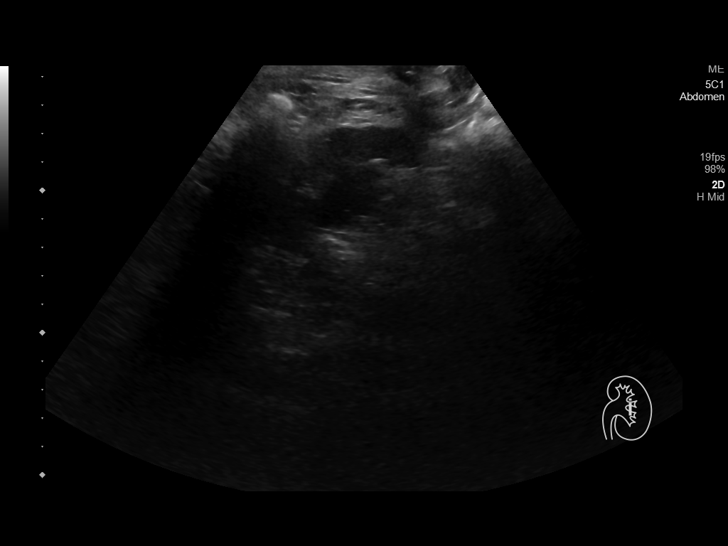
[im 16/26]
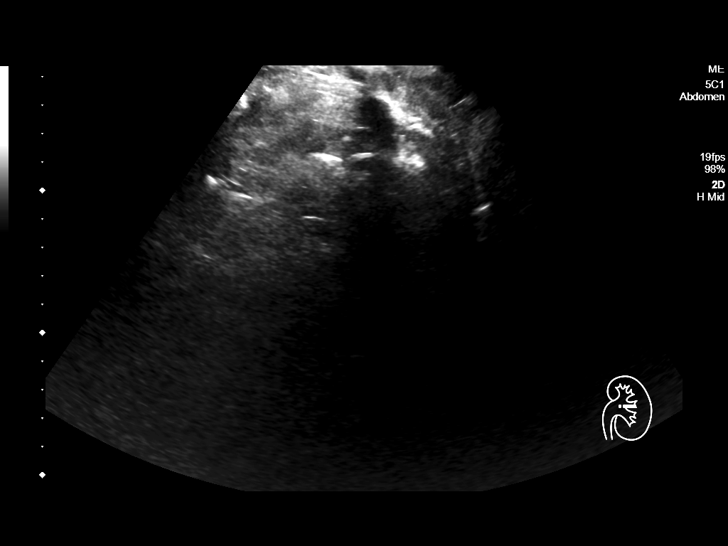
[im 17/26]
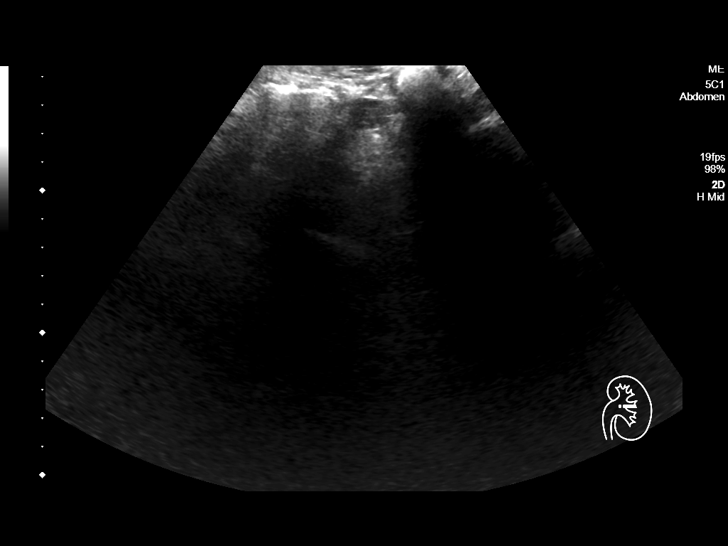
[im 19/26]
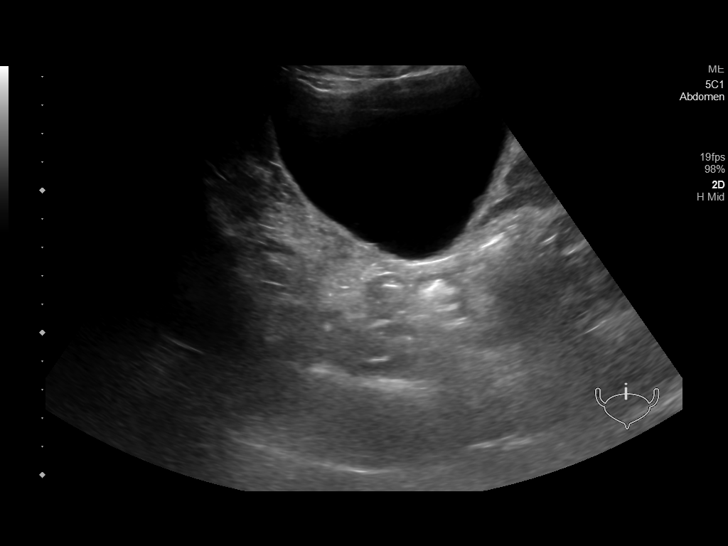
[im 21/26]
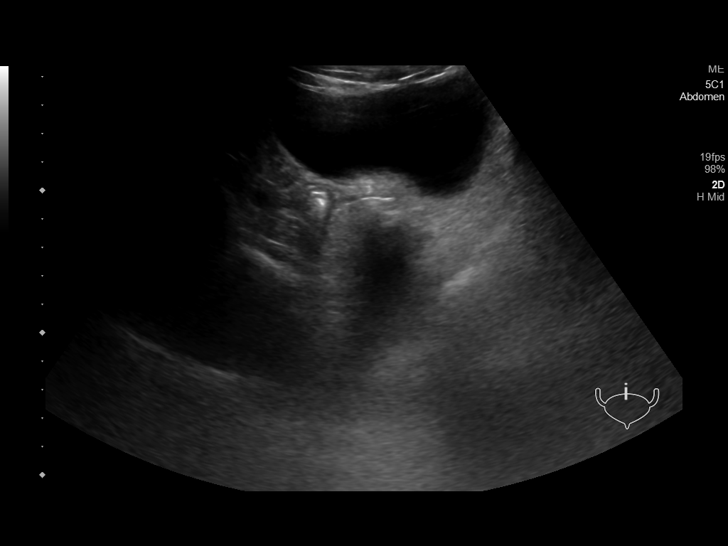
[im 23/26]
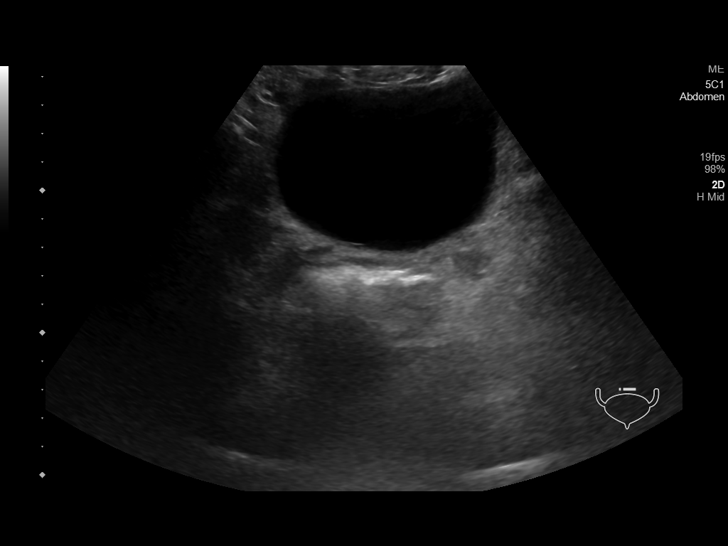
[im 26/26]
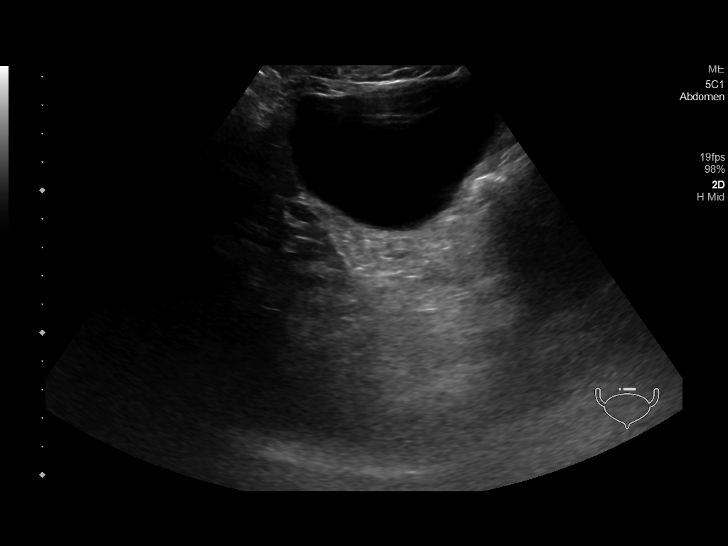

[14 of 25 positions shown; findings below may reference images not displayed]

FINDINGS: Right Kidney:

Length: 9.9 cm. Echogenicity within normal limits. No mass or
hydronephrosis visualized.

Left Kidney:

Not visualized. Patient could not be repositioned due to restraints
and patient combative.

Bladder:

Appears normal for degree of bladder distention.
IMPRESSION: 1. Normal right kidney.  No right hydronephrosis.
2. Left kidney not visualized with technical limitations as above.

By: Danii Aujla M.D.
